# Patient Record
Sex: Female | Born: 1950 | Race: Black or African American | Hispanic: No
Health system: Southern US, Community
[De-identification: ages and names within clinical notes are randomized; demographics above are authoritative.]

## PROBLEM LIST (undated history)

## (undated) DIAGNOSIS — T783XXA Angioneurotic edema, initial encounter: Secondary | ICD-10-CM

## (undated) HISTORY — DX: Angioneurotic edema, initial encounter: T78.3XXA

---

## 2010-06-04 ENCOUNTER — Encounter (HOSPITAL_COMMUNITY): Payer: Self-pay | Admitting: Radiology

## 2010-06-04 ENCOUNTER — Emergency Department (HOSPITAL_COMMUNITY): Payer: Self-pay

## 2010-06-04 ENCOUNTER — Emergency Department (HOSPITAL_COMMUNITY)
Admission: EM | Admit: 2010-06-04 | Discharge: 2010-06-04 | Disposition: A | Discharge status: Home / Self Care | Payer: Self-pay | Attending: Emergency Medicine | Admitting: Emergency Medicine

## 2010-06-04 DIAGNOSIS — R51 Headache: Secondary | ICD-10-CM | POA: Insufficient documentation

## 2010-06-22 ENCOUNTER — Emergency Department (HOSPITAL_COMMUNITY)
Admission: EM | Admit: 2010-06-22 | Discharge: 2010-06-22 | Disposition: A | Discharge status: Home / Self Care | Payer: Self-pay | Attending: Emergency Medicine | Admitting: Emergency Medicine

## 2010-06-22 ENCOUNTER — Emergency Department (HOSPITAL_COMMUNITY): Payer: Self-pay

## 2010-06-22 DIAGNOSIS — M25519 Pain in unspecified shoulder: Secondary | ICD-10-CM | POA: Insufficient documentation

## 2010-06-22 DIAGNOSIS — R079 Chest pain, unspecified: Secondary | ICD-10-CM | POA: Insufficient documentation

## 2010-06-22 DIAGNOSIS — S335XXA Sprain of ligaments of lumbar spine, initial encounter: Secondary | ICD-10-CM | POA: Insufficient documentation

## 2010-06-22 DIAGNOSIS — IMO0002 Reserved for concepts with insufficient information to code with codable children: Secondary | ICD-10-CM | POA: Insufficient documentation

## 2010-06-22 DIAGNOSIS — M545 Low back pain, unspecified: Secondary | ICD-10-CM | POA: Insufficient documentation

## 2010-06-22 DIAGNOSIS — Y929 Unspecified place or not applicable: Secondary | ICD-10-CM | POA: Insufficient documentation

## 2012-04-25 ENCOUNTER — Emergency Department (HOSPITAL_COMMUNITY): Payer: Self-pay

## 2012-04-25 ENCOUNTER — Encounter (HOSPITAL_COMMUNITY): Payer: Self-pay | Admitting: Emergency Medicine

## 2012-04-25 ENCOUNTER — Emergency Department (HOSPITAL_COMMUNITY)
Admission: EM | Admit: 2012-04-25 | Discharge: 2012-04-25 | Disposition: A | Discharge status: Home / Self Care | Payer: Self-pay | Attending: Emergency Medicine | Admitting: Emergency Medicine

## 2012-04-25 DIAGNOSIS — M549 Dorsalgia, unspecified: Secondary | ICD-10-CM | POA: Insufficient documentation

## 2012-04-25 DIAGNOSIS — D259 Leiomyoma of uterus, unspecified: Secondary | ICD-10-CM | POA: Insufficient documentation

## 2012-04-25 DIAGNOSIS — N390 Urinary tract infection, site not specified: Secondary | ICD-10-CM | POA: Insufficient documentation

## 2012-04-25 DIAGNOSIS — D219 Benign neoplasm of connective and other soft tissue, unspecified: Secondary | ICD-10-CM

## 2012-04-25 LAB — CBC WITH DIFFERENTIAL/PLATELET
Eosinophils Absolute: 0.1 10*3/uL (ref 0.0–0.7)
Eosinophils Relative: 1 % (ref 0–5)
Lymphs Abs: 1.4 10*3/uL (ref 0.7–4.0)
MCH: 27.1 pg (ref 26.0–34.0)
MCV: 83.9 fL (ref 78.0–100.0)
Monocytes Relative: 8 % (ref 3–12)
Platelets: 242 10*3/uL (ref 150–400)
RBC: 4.17 MIL/uL (ref 3.87–5.11)

## 2012-04-25 LAB — BASIC METABOLIC PANEL
BUN: 15 mg/dL (ref 6–23)
Calcium: 9.5 mg/dL (ref 8.4–10.5)
GFR calc non Af Amer: >90 mL/min (ref >90)
Glucose, Bld: 96 mg/dL (ref 70–99)
Sodium: 140 mEq/L (ref 135–145)

## 2012-04-25 LAB — URINALYSIS, ROUTINE W REFLEX MICROSCOPIC
Bilirubin Urine: NEGATIVE
Ketones, ur: NEGATIVE mg/dL
Protein, ur: NEGATIVE mg/dL
Specific Gravity, Urine: 1.018 (ref 1.005–1.030)
Urobilinogen, UA: 1 mg/dL (ref 0.0–1.0)

## 2012-04-25 LAB — URINE MICROSCOPIC-ADD ON

## 2012-04-25 MED ORDER — HYDROCODONE-ACETAMINOPHEN 5-325 MG PO TABS: 1.0000 | ORAL_TABLET | Freq: Four times a day (QID) | ORAL | Status: DC | PRN

## 2012-04-25 MED ORDER — HYDROMORPHONE HCL PF 1 MG/ML IJ SOLN: 1.0000 mg | Freq: Once | INTRAMUSCULAR | Status: DC

## 2012-04-25 MED ORDER — OXYCODONE-ACETAMINOPHEN 5-325 MG PO TABS
2.0000 | ORAL_TABLET | Freq: Once | ORAL | Status: AC
  Administered 2012-04-25: 2 via ORAL
  Filled 2012-04-25: qty 2

## 2012-04-25 MED ORDER — CEPHALEXIN 500 MG PO CAPS: 500.0000 mg | ORAL_CAPSULE | Freq: Four times a day (QID) | ORAL | Status: DC

## 2012-04-25 NOTE — ED Notes (Signed)
Pt reports right sided flank pain; feeling like someone punched her and goes into back; pressure when she feels like she has to urinate.

## 2012-04-25 NOTE — ED Provider Notes (Signed)
Medical screening examination/treatment/procedure(s) were performed by non-physician practitioner and as supervising physician I was immediately available for consultation/collaboration.  Geoffery Lyons, MD 04/25/12 2122

## 2012-04-25 NOTE — ED Provider Notes (Signed)
History     CSN: [id number]  Arrival date & time 04/25/12  1052   First MD Initiated Contact with Patient 04/25/12 1111      Chief Complaint  Patient presents with  . Flank Pain    (Consider location/radiation/quality/duration/timing/severity/associated sxs/prior treatment) HPI Comments: 62 year old female presents to the emergency department complaining of right-sided flank pain radiating to her back worsening over the past week. Back in November she felt as if she was punched on her right side and had a sharp pain shoot to her back. The pain was intermittent since, increasing induration over the past week. Nothing in specific makes the pain come or go. She tried taking Tylenol without relief. Admits to associated pressure feeling when she has to urinate. Denies dysuria, hematuria, increased frequency or urgency. No history of kidney stones. Denies fever, chills, nausea or vomiting.  Patient is a 62 y.o. female presenting with flank pain. The history is provided by the patient.  Flank Pain Pertinent negatives include no chest pain, chills, fever, nausea or vomiting.    No past medical history on file.  No past surgical history on file.  No family history on file.  History  Substance Use Topics  . Smoking status: Not on file  . Smokeless tobacco: Not on file  . Alcohol Use: Not on file    OB History    Grav Para Term Preterm Abortions TAB SAB Ect Mult Living                  Review of Systems  Constitutional: Negative for fever, chills and appetite change.  Respiratory: Negative for shortness of breath.   Cardiovascular: Negative for chest pain.  Gastrointestinal: Negative for nausea and vomiting.  Genitourinary: Positive for flank pain. Negative for dysuria, urgency, frequency and hematuria.  Musculoskeletal: Positive for back pain.  Neurological: Negative.   All other systems reviewed and are negative.    Allergies  Review of patient's allergies indicates no  known allergies.  Home Medications   Current Outpatient Rx  Name  Route  Sig  Dispense  Refill  . ACETAMINOPHEN 500 MG PO TABS   Oral   Take 1,000 mg by mouth every 6 (six) hours as needed. For pain           BP 125/73  Pulse 64  Temp 99 F (37.2 C) (Oral)  Resp 22  SpO2 97%  Physical Exam  Nursing note and vitals reviewed. Constitutional: She is oriented to person, place, and time. She appears well-developed. No distress.       Overweight  HENT:  Head: Normocephalic and atraumatic.  Mouth/Throat: Oropharynx is clear and moist.  Eyes: Conjunctivae normal and EOM are normal. Pupils are equal, round, and reactive to light.  Neck: Normal range of motion. Neck supple.  Cardiovascular: Normal rate, regular rhythm and normal heart sounds.   Pulmonary/Chest: Effort normal and breath sounds normal.  Abdominal: Soft. Bowel sounds are normal. There is tenderness (mild to deep palpation) in the suprapubic area. There is CVA tenderness (right). There is no rigidity, no rebound and no guarding.  Musculoskeletal: Normal range of motion. She exhibits no edema.  Neurological: She is alert and oriented to person, place, and time.  Skin: Skin is warm and dry.  Psychiatric: She has a normal mood and affect. Her behavior is normal.    ED Course  Procedures (including critical care time)  Labs Reviewed  URINALYSIS, ROUTINE W REFLEX MICROSCOPIC - Abnormal; Notable for the following:  APPearance TURBID (*)     Hgb urine dipstick TRACE (*)     Nitrite POSITIVE (*)     Leukocytes, UA LARGE (*)     All other components within normal limits  URINE MICROSCOPIC-ADD ON - Abnormal; Notable for the following:    Bacteria, UA MANY (*)     All other components within normal limits  CBC WITH DIFFERENTIAL - Abnormal; Notable for the following:    Hemoglobin 11.3 (*)     HCT 35.0 (*)     All other components within normal limits  BASIC METABOLIC PANEL  URINE CULTURE   Ct Abdomen Pelvis Wo  Contrast  04/25/2012  *RADIOLOGY REPORT*  Clinical Data: Right flank pain radiating to the back.  CT ABDOMEN AND PELVIS WITHOUT CONTRAST  Technique:  Multidetector CT imaging of the abdomen and pelvis was performed following the standard protocol without intravenous contrast.  Comparison: None.  Findings:  The visualized portion of the liver, spleen, pancreas, and adrenal glands appear unremarkable in noncontrast CT appearance.  The gallbladder and biliary system appear unremarkable.  No pathologic retroperitoneal or porta hepatis adenopathy is identified.  Appendix normal.  No renal stones or hydronephrosis.  Renal contour unremarkable.  A broad ventral supraumbilical hernia contains omental adipose tissue.  Urinary bladder unremarkable.  Left ovarian contour unremarkable. Large calcified uterine fibroids include an anterior fundal densely calcified 8.5 x 7.2 x 7.4 cm subserosal fibroid, a densely calcified 3.8 x 4.4 by 3.8 cm left uterine body intramural fibroid, and multiple other less calcified fibroids throughout the uterus somewhat distorting the uterine contour.  Uterine length including the fundal fibroid is 14.6 cm.  The right ovary is not well seen inseparable from the uterine contour.  Left ovary unremarkable.  IMPRESSION:  1.  Large fibroid uterus is likely exerting extrinsic mass effect on the urinary bladder. 2.  No urinary tract calculi or hydronephrosis identified. 3.  Broad ventral supraumbilical hernia contains omental adipose tissue.   Original Report Authenticated By: Gaylyn Rong, M.D.      1. Urinary tract infection   2. Fibroids       MDM  62 year old female with urinary tract infection and uterine fibroids. CAT scan not showing any kidney stones, however it is showing large fibroid uterus pushing on the bladder causing her bladder pressure. UA positive for nitrites, many bacteria and too numerous to count white blood cells. Will prescribe her Keflex for the urinary tract  infection along with pain medication for fibroids. [facility] Resource list also given to establish care with a primary care physician. Patient states her understanding of plan and is agreeable. Return precautions discussed. Patient is afebrile with normal vital signs. Stable for discharge.        Trevor Mace, PA-C 04/25/12 1302

## 2012-04-27 LAB — URINE CULTURE

## 2012-04-28 NOTE — ED Notes (Signed)
+   Urine Patient treated with keflex-sensitive to same-chart appended per protocol MD. 

## 2012-05-23 ENCOUNTER — Emergency Department (HOSPITAL_COMMUNITY)
Admission: EM | Admit: 2012-05-23 | Discharge: 2012-05-23 | Disposition: A | Discharge status: Home / Self Care | Payer: Self-pay | Source: Home / Self Care | Attending: Emergency Medicine | Admitting: Emergency Medicine

## 2012-05-23 ENCOUNTER — Encounter (HOSPITAL_COMMUNITY): Payer: Self-pay

## 2012-05-23 DIAGNOSIS — D219 Benign neoplasm of connective and other soft tissue, unspecified: Secondary | ICD-10-CM

## 2012-05-23 DIAGNOSIS — N390 Urinary tract infection, site not specified: Secondary | ICD-10-CM

## 2012-05-23 DIAGNOSIS — D259 Leiomyoma of uterus, unspecified: Secondary | ICD-10-CM

## 2012-05-23 LAB — POCT URINALYSIS DIP (DEVICE)
Nitrite: POSITIVE — AB
Protein, ur: NEGATIVE mg/dL
Urobilinogen, UA: 2 mg/dL — ABNORMAL HIGH (ref 0.0–1.0)
pH: 5.5 (ref 5.0–8.0)

## 2012-05-23 MED ORDER — NITROFURANTOIN MONOHYD MACRO 100 MG PO CAPS: 100.0000 mg | ORAL_CAPSULE | Freq: Two times a day (BID) | ORAL | Status: DC

## 2012-05-23 MED ORDER — HYDROCODONE-ACETAMINOPHEN 5-325 MG PO TABS: 1.0000 | ORAL_TABLET | ORAL | Status: DC | PRN

## 2012-05-23 NOTE — ED Provider Notes (Signed)
Medical screening examination/treatment/procedure(s) were performed by non-physician practitioner and as supervising physician I was immediately available for consultation/collaboration.  Raynald Blend, MD 05/23/12 1901

## 2012-05-23 NOTE — ED Provider Notes (Signed)
History     CSN: [id number]  Arrival date & time 05/23/12  1530   First MD Initiated Contact with Patient 05/23/12 1745      Chief Complaint  Patient presents with  . Flank Pain    HPI: Patient is a 62 y.o. female presenting with flank pain. The history is provided by the patient.  Flank Pain This is a recurrent problem. The current episode started more than 1 week ago. The problem occurs constantly. The problem has been gradually worsening. Pertinent negatives include no chest pain, no abdominal pain, no headaches and no shortness of breath. Nothing relieves the symptoms.  Pt seen in ED at [facility] for same sx's on 04/25/2012 and treated for UTI. Ct scan at the time did not show kidney stones but did reveal large fibroids. Pt states she took her medication and symptoms did improve but never really resolved. She was supposed to f/u at [facility] but did not. She now has worsening bil flank pain R>L, urgency, frequency and "pressure" w/ urination. Pt denies fever, N/V, unusual vag d/c and states she has not been sexually active in > 10 yrs.   History reviewed. No pertinent past medical history.  History reviewed. No pertinent past surgical history.  No family history on file.  History  Substance Use Topics  . Smoking status: Never Smoker   . Smokeless tobacco: Not on file  . Alcohol Use: Yes    OB History    Grav Para Term Preterm Abortions TAB SAB Ect Mult Living                  Review of Systems  Constitutional: Negative.   HENT: Negative.   Eyes: Negative.   Respiratory: Negative.  Negative for shortness of breath.   Cardiovascular: Negative.  Negative for chest pain.  Gastrointestinal: Negative.  Negative for abdominal pain.  Genitourinary: Positive for dysuria, urgency, frequency and flank pain.  Musculoskeletal: Negative.   Neurological: Negative.  Negative for headaches.  Hematological: Negative.   Psychiatric/Behavioral: Negative.     Allergies  Review  of patient's allergies indicates no known allergies.  Home Medications   Current Outpatient Rx  Name  Route  Sig  Dispense  Refill  . ACETAMINOPHEN 500 MG PO TABS   Oral   Take 1,000 mg by mouth every 6 (six) hours as needed. For pain         . CEPHALEXIN 500 MG PO CAPS   Oral   Take 1 capsule (500 mg total) by mouth 4 (four) times daily.   40 capsule   0   . HYDROCODONE-ACETAMINOPHEN 5-325 MG PO TABS   Oral   Take 1-2 tablets by mouth every 6 (six) hours as needed for pain.   15 tablet   0     BP 120/90  Pulse 66  Temp 98.2 F (36.8 C) (Oral)  Resp 18  SpO2 98%  Physical Exam  Constitutional: She is oriented to person, place, and time. She appears well-developed and well-nourished.  HENT:  Head: Normocephalic and atraumatic.  Eyes: Conjunctivae normal are normal.  Neck: Neck supple.  Cardiovascular: Normal rate and regular rhythm.   Pulmonary/Chest: Effort normal and breath sounds normal.  Abdominal: Soft. Bowel sounds are normal.  Musculoskeletal: Normal range of motion.  Neurological: She is alert and oriented to person, place, and time.  Skin: Skin is warm and dry.  Psychiatric: She has a normal mood and affect.    ED Course  Procedures (including critical care time)  Labs Reviewed  POCT URINALYSIS DIP (DEVICE) - Abnormal; Notable for the following:    Hgb urine dipstick SMALL (*)     Urobilinogen, UA 2.0 (*)     Nitrite POSITIVE (*)     Leukocytes, UA TRACE (*)  Biochemical Testing Only. Please order routine urinalysis from main lab if confirmatory testing is needed.   All other components within normal limits  URINALYSIS, DIPSTICK ONLY   No results found.   No diagnosis found.    MDM  ED note from 04/25/2012:    62 year old female with urinary tract infection and uterine fibroids. CAT scan not showing any kidney stones, however it is showing large fibroid uterus pushing on the bladder causing her bladder pressure. UA positive for nitrites, many  bacteria and too numerous to count white blood cells. Will prescribe her Keflex for the urinary tract infection along with pain medication for fibroids. [facility] Resource list also given to establish care with a primary care physician. Patient states her understanding of plan and is agreeable. Return precautions discussed. Patient is afebrile with normal vital signs. Stable for discharge.  Discussed need for pt to arrange f/u for further eval of her fibroids as this may be the source of the majority of her symptoms. Will treat UTI w/ Macrobid and provide another short course of med for pain. Pt agreeable.         Leanne Chang, NP 05/23/12 1900

## 2012-05-23 NOTE — ED Notes (Signed)
Patient complains of right and left flank pain, more pain on right than left, states that she feels pressure after urinating and that she has, scant urine when she goes to the restroom most of the time sx have been getting worse and started in November, patient has no PCP , pain is constant with some stabbing pains at time

## 2013-10-28 ENCOUNTER — Encounter (HOSPITAL_COMMUNITY): Payer: Self-pay | Admitting: Emergency Medicine

## 2013-10-28 ENCOUNTER — Emergency Department (INDEPENDENT_AMBULATORY_CARE_PROVIDER_SITE_OTHER)
Admission: EM | Admit: 2013-10-28 | Discharge: 2013-10-28 | Disposition: A | Discharge status: Home / Self Care | Payer: Self-pay | Source: Home / Self Care | Attending: Family Medicine | Admitting: Family Medicine

## 2013-10-28 DIAGNOSIS — M545 Low back pain, unspecified: Secondary | ICD-10-CM

## 2013-10-28 DIAGNOSIS — N390 Urinary tract infection, site not specified: Secondary | ICD-10-CM

## 2013-10-28 LAB — POCT URINALYSIS DIP (DEVICE)
BILIRUBIN URINE: NEGATIVE
GLUCOSE, UA: NEGATIVE mg/dL
KETONES UR: NEGATIVE mg/dL
NITRITE: POSITIVE — AB
PH: 5.5 (ref 5.0–8.0)
Protein, ur: NEGATIVE mg/dL
SPECIFIC GRAVITY, URINE: 1.015 (ref 1.005–1.030)
Urobilinogen, UA: 1 mg/dL (ref 0.0–1.0)

## 2013-10-28 MED ORDER — IBUPROFEN 800 MG PO TABS: 800.0000 mg | ORAL_TABLET | Freq: Three times a day (TID) | ORAL | Status: DC | PRN

## 2013-10-28 MED ORDER — CEFUROXIME AXETIL 250 MG PO TABS: 250.0000 mg | ORAL_TABLET | Freq: Two times a day (BID) | ORAL | Status: DC

## 2013-10-28 NOTE — ED Provider Notes (Signed)
CSN: [id number]     Arrival date & time 10/28/13  1448 History   First MD Initiated Contact with Patient 10/28/13 1601     Chief Complaint  Patient presents with  . Back Pain   (Consider location/radiation/quality/duration/timing/severity/associated sxs/prior Treatment) HPI Comments: 63 year old female presents complaining of left-sided lower back pain for 7 days, progressively worsening. She has had associated some gross hematuria and lower abdominal pressure with urinating. Her pain has been gradually worsening. She has had similar symptoms to this in the past associated with UTI. He does flank pain, fever, chills, NVD. No recent travel or sick contacts.  Patient is a 63 y.o. female presenting with back pain.  Back Pain Associated symptoms: abdominal pain and dysuria   Associated symptoms: no fever     History reviewed. No pertinent past medical history. History reviewed. No pertinent past surgical history. No family history on file. History  Substance Use Topics  . Smoking status: Never Smoker   . Smokeless tobacco: Not on file  . Alcohol Use: Yes   OB History   Grav Para Term Preterm Abortions TAB SAB Ect Mult Living                 Review of Systems  Constitutional: Negative for fever and chills.  Gastrointestinal: Positive for abdominal pain. Negative for nausea, vomiting and diarrhea.  Genitourinary: Positive for dysuria, urgency, frequency and hematuria. Negative for flank pain.  Musculoskeletal: Positive for back pain.    Allergies  Review of patient's allergies indicates no known allergies.  Home Medications   Prior to Admission medications   Medication Sig Start Date End Date Taking? Authorizing Provider  acetaminophen (TYLENOL) 500 MG tablet Take 1,000 mg by mouth every 6 (six) hours as needed. For pain    Historical Provider, MD  cefUROXime (CEFTIN) 250 MG tablet Take 1 tablet (250 mg total) by mouth 2 (two) times daily with a meal. 10/28/13   Liam Graham,  PA-C  cephALEXin (KEFLEX) 500 MG capsule Take 1 capsule (500 mg total) by mouth 4 (four) times daily. 04/25/12   Illene Labrador, PA-C  HYDROcodone-acetaminophen (NORCO/VICODIN) 5-325 MG per tablet Take 1-2 tablets by mouth every 6 (six) hours as needed for pain. 04/25/12   Illene Labrador, PA-C  HYDROcodone-acetaminophen (NORCO/VICODIN) 5-325 MG per tablet Take 1 tablet by mouth every 4 (four) hours as needed for pain. 05/23/12   Rhetta Mura Schorr, NP  ibuprofen (ADVIL,MOTRIN) 800 MG tablet Take 1 tablet (800 mg total) by mouth every 8 (eight) hours as needed. 10/28/13   Liam Graham, PA-C  nitrofurantoin, macrocrystal-monohydrate, (MACROBID) 100 MG capsule Take 1 capsule (100 mg total) by mouth 2 (two) times daily. 05/23/12   Rhetta Mura Schorr, NP   BP 138/69  Pulse 61  Temp(Src) 98.7 F (37.1 C) (Oral)  Resp 16  SpO2 100% Physical Exam  Nursing note and vitals reviewed. Constitutional: She is oriented to person, place, and time. Vital signs are normal. She appears well-developed and well-nourished. No distress.  HENT:  Head: Normocephalic and atraumatic.  Cardiovascular: Normal rate, regular rhythm and normal heart sounds.   Pulmonary/Chest: Effort normal and breath sounds normal. No respiratory distress.  Abdominal: Soft. She exhibits no distension and no mass. There is no tenderness. There is no rebound, no guarding and no CVA tenderness.  Musculoskeletal:       Lumbar back: Normal.  Neurological: She is alert and oriented to person, place, and time. She has normal strength. Coordination normal.  Skin: Skin is warm and dry. No rash noted. She is not diaphoretic.  Psychiatric: She has a normal mood and affect. Judgment normal.    ED Course  Procedures (including critical care time) Labs Review Labs Reviewed  POCT URINALYSIS DIP (DEVICE) - Abnormal; Notable for the following:    Hgb urine dipstick LARGE (*)    Nitrite POSITIVE (*)    Leukocytes, UA MODERATE (*)    All other  components within normal limits  URINE CULTURE    Imaging Review No results found.   MDM   1. UTI (lower urinary tract infection)   2. Left-sided low back pain without sciatica    Physical exam is normal. Urine is positive for leukocytes, nitrites, blood. Treating for a UTI. Followup if no improvement in a couple days. Urine culture sent   Meds ordered this encounter  Medications  . cefUROXime (CEFTIN) 250 MG tablet    Sig: Take 1 tablet (250 mg total) by mouth 2 (two) times daily with a meal.    Dispense:  10 tablet    Refill:  0    Order Specific Question:  Supervising Provider    Answer:  Jake Michaelis, DAVID C [id number]  . ibuprofen (ADVIL,MOTRIN) 800 MG tablet    Sig: Take 1 tablet (800 mg total) by mouth every 8 (eight) hours as needed.    Dispense:  30 tablet    Refill:  0    Order Specific Question:  Supervising Provider    Answer:  Jake Michaelis, DAVID C [[id number]]       Liam Graham, PA-C 10/28/13 [phone]

## 2013-10-28 NOTE — Discharge Instructions (Signed)
Back Pain, Adult °Low back pain is very common. About 1 in 5 people have back pain. The cause of low back pain is rarely dangerous. The pain often gets better over time. About half of people with a sudden onset of back pain feel better in just 2 weeks. About 8 in 10 people feel better by 6 weeks.  °CAUSES °Some common causes of back pain include: °· Strain of the muscles or ligaments supporting the spine. °· Wear and tear (degeneration) of the spinal discs. °· Arthritis. °· Direct injury to the back. °DIAGNOSIS °Most of the time, the direct cause of low back pain is not known. However, back pain can be treated effectively even when the exact cause of the pain is unknown. Answering your caregiver's questions about your overall health and symptoms is one of the most accurate ways to make sure the cause of your pain is not dangerous. If your caregiver needs more information, he or she may order lab work or imaging tests (X-rays or MRIs). However, even if imaging tests show changes in your back, this usually does not require surgery. °HOME CARE INSTRUCTIONS °For many people, back pain returns. Since low back pain is rarely dangerous, it is often a condition that people can learn to manage on their own.  °· Remain active. It is stressful on the back to sit or stand in one place. Do not sit, drive, or stand in one place for more than 30 minutes at a time. Take short walks on level surfaces as soon as pain allows. Try to increase the length of time you walk each day. °· Do not stay in bed. Resting more than 1 or 2 days can delay your recovery. °· Do not avoid exercise or work. Your body is made to move. It is not dangerous to be active, even though your back may hurt. Your back will likely heal faster if you return to being active before your pain is gone. °· Pay attention to your body when you  bend and lift. Many people have less discomfort when lifting if they bend their knees, keep the load close to their bodies, and  avoid twisting. Often, the most comfortable positions are those that put less stress on your recovering back. °· Find a comfortable position to sleep. Use a firm mattress and lie on your side with your knees slightly bent. If you lie on your back, put a pillow under your knees. °· Only take over-the-counter or prescription medicines as directed by your caregiver. Over-the-counter medicines to reduce pain and inflammation are often the most helpful. Your caregiver may prescribe muscle relaxant drugs. These medicines help dull your pain so you can more quickly return to your normal activities and healthy exercise. °· Put ice on the injured area. °¨ Put ice in a plastic bag. °¨ Place a towel between your skin and the bag. °¨ Leave the ice on for 15-20 minutes, 03-04 times a day for the first 2 to 3 days. After that, ice and heat may be alternated to reduce pain and spasms. °· Ask your caregiver about trying back exercises and gentle massage. This may be of some benefit. °· Avoid feeling anxious or stressed. Stress increases muscle tension and can worsen back pain. It is important to recognize when you are anxious or stressed and learn ways to manage it. Exercise is a great option. °SEEK MEDICAL CARE IF: °· You have pain that is not relieved with rest or medicine. °· You have pain that does not improve in 1 week. °· You have new symptoms. °· You are generally not feeling well. °SEEK   IMMEDIATE MEDICAL CARE IF:   You have pain that radiates from your back into your legs.  You develop new bowel or bladder control problems.  You have unusual weakness or numbness in your arms or legs.  You develop nausea or vomiting.  You develop abdominal pain.  You feel faint. Document Released: 04/07/2005 Document Revised: 10/07/2011 Document Reviewed: 08/26/2010 [facility] Patient Information 2015 [place], [place]. This information is not intended to replace advice given to you by your health care provider. Make sure you  discuss any questions you have with your health care provider.  Urinary Tract Infection Urinary tract infections (UTIs) can develop anywhere along your urinary tract. Your urinary tract is your body's drainage system for removing wastes and extra water. Your urinary tract includes two kidneys, two ureters, a bladder, and a urethra. Your kidneys are a pair of bean-shaped organs. Each kidney is about the size of your fist. They are located below your ribs, one on each side of your spine. CAUSES Infections are caused by microbes, which are microscopic organisms, including fungi, viruses, and bacteria. These organisms are so small that they can only be seen through a microscope. Bacteria are the microbes that most commonly cause UTIs. SYMPTOMS  Symptoms of UTIs may vary by age and gender of the patient and by the location of the infection. Symptoms in young women typically include a frequent and intense urge to urinate and a painful, burning feeling in the bladder or urethra during urination. Older women and men are more likely to be tired, shaky, and weak and have muscle aches and abdominal pain. A fever may mean the infection is in your kidneys. Other symptoms of a kidney infection include pain in your back or sides below the ribs, nausea, and vomiting. DIAGNOSIS To diagnose a UTI, your caregiver will ask you about your symptoms. Your caregiver also will ask to provide a urine sample. The urine sample will be tested for bacteria and white blood cells. White blood cells are made by your body to help fight infection. TREATMENT  Typically, UTIs can be treated with medication. Because most UTIs are caused by a bacterial infection, they usually can be treated with the use of antibiotics. The choice of antibiotic and length of treatment depend on your symptoms and the type of bacteria causing your infection. HOME CARE INSTRUCTIONS  If you were prescribed antibiotics, take them exactly as your caregiver  instructs you. Finish the medication even if you feel better after you have only taken some of the medication.  Drink enough water and fluids to keep your urine clear or pale yellow.  Avoid caffeine, tea, and carbonated beverages. They tend to irritate your bladder.  Empty your bladder often. Avoid holding urine for long periods of time.  Empty your bladder before and after sexual intercourse.  After a bowel movement, women should cleanse from front to back. Use each tissue only once. SEEK MEDICAL CARE IF:   You have back pain.  You develop a fever.  Your symptoms do not begin to resolve within 3 days. SEEK IMMEDIATE MEDICAL CARE IF:   You have severe back pain or lower abdominal pain.  You develop chills.  You have nausea or vomiting.  You have continued burning or discomfort with urination. MAKE SURE YOU:   Understand these instructions.  Will watch your condition.  Will get help right away if you are not doing well or get worse. Document Released: 01/15/2005 Document Revised: 10/07/2011 Document Reviewed: 05/16/2011 [employer] Patient Information  2015 [employer]. This information is not intended to replace advice given to you by your health care provider. Make sure you discuss any questions you have with your health care provider.

## 2013-10-28 NOTE — ED Notes (Signed)
Pt c/o lower back pain onset 7 days; getting worse; increases w/activity Sx also include: hematuria, abd pressure Denies f/v/n/d Slow gait Alert w/no signs of acute distress.

## 2013-10-29 LAB — URINE CULTURE: Colony Count: >=100000

## 2013-10-30 NOTE — ED Provider Notes (Signed)
Medical screening examination/treatment/procedure(s) were performed by resident physician or non-physician practitioner and as supervising physician I was immediately available for consultation/collaboration.   Pauline Good MD.   Billy Fischer, MD 10/30/13 [phone]

## 2014-03-20 ENCOUNTER — Emergency Department (HOSPITAL_COMMUNITY)
Admission: EM | Admit: 2014-03-20 | Discharge: 2014-03-20 | Disposition: A | Discharge status: Home / Self Care | Payer: Self-pay | Attending: Emergency Medicine | Admitting: Emergency Medicine

## 2014-03-20 DIAGNOSIS — Y9389 Activity, other specified: Secondary | ICD-10-CM | POA: Insufficient documentation

## 2014-03-20 DIAGNOSIS — Y998 Other external cause status: Secondary | ICD-10-CM | POA: Insufficient documentation

## 2014-03-20 DIAGNOSIS — X58XXXA Exposure to other specified factors, initial encounter: Secondary | ICD-10-CM | POA: Insufficient documentation

## 2014-03-20 DIAGNOSIS — T783XXA Angioneurotic edema, initial encounter: Secondary | ICD-10-CM | POA: Insufficient documentation

## 2014-03-20 DIAGNOSIS — Z792 Long term (current) use of antibiotics: Secondary | ICD-10-CM | POA: Insufficient documentation

## 2014-03-20 DIAGNOSIS — Z7982 Long term (current) use of aspirin: Secondary | ICD-10-CM | POA: Insufficient documentation

## 2014-03-20 DIAGNOSIS — Z79899 Other long term (current) drug therapy: Secondary | ICD-10-CM | POA: Insufficient documentation

## 2014-03-20 DIAGNOSIS — Y92511 Restaurant or cafe as the place of occurrence of the external cause: Secondary | ICD-10-CM | POA: Insufficient documentation

## 2014-03-20 MED ORDER — FAMOTIDINE 20 MG PO TABS: 20.0000 mg | ORAL_TABLET | Freq: Two times a day (BID) | ORAL | Status: DC

## 2014-03-20 MED ORDER — METHYLPREDNISOLONE SODIUM SUCC 125 MG IJ SOLR
125.0000 mg | Freq: Once | INTRAMUSCULAR | Status: AC
  Administered 2014-03-20: 125 mg via INTRAVENOUS
  Filled 2014-03-20: qty 2

## 2014-03-20 MED ORDER — PREDNISONE 20 MG PO TABS: 60.0000 mg | ORAL_TABLET | Freq: Every day | ORAL | Status: DC

## 2014-03-20 MED ORDER — DIPHENHYDRAMINE HCL 50 MG/ML IJ SOLN
25.0000 mg | Freq: Once | INTRAMUSCULAR | Status: AC
  Administered 2014-03-20: 25 mg via INTRAVENOUS
  Filled 2014-03-20: qty 1

## 2014-03-20 MED ORDER — LORATADINE 10 MG PO TABS: 10.0000 mg | ORAL_TABLET | Freq: Every day | ORAL | Status: DC

## 2014-03-20 MED ORDER — FAMOTIDINE IN NACL 20-0.9 MG/50ML-% IV SOLN
20.0000 mg | Freq: Once | INTRAVENOUS | Status: AC
  Administered 2014-03-20: 20 mg via INTRAVENOUS
  Filled 2014-03-20: qty 50

## 2014-03-20 MED ORDER — DIPHENHYDRAMINE HCL 25 MG PO TABS: 25.0000 mg | ORAL_TABLET | Freq: Four times a day (QID) | ORAL | Status: DC

## 2014-03-20 NOTE — ED Notes (Signed)
Pt sleeping, reports tongue feels better, "still feels sore on the sides and swollen". No respiratory distress.

## 2014-03-20 NOTE — ED Notes (Signed)
The pt just finished a 12 hour drive from [place]  Back home in [place]

## 2014-03-20 NOTE — ED Provider Notes (Signed)
CSN: [id number]     Arrival date & time 03/20/14  0105 History   First MD Initiated Contact with Patient 03/20/14 0114     Chief Complaint  Patient presents with  . Oral Swelling     (Consider location/radiation/quality/duration/timing/severity/associated sxs/prior Treatment) HPI 63 year old female presents to the emergency department with complaint of tongue swelling.  Symptoms started about 2 hours ago after having a milkshake for [employer] with cherries and two cucumbers.  She denies previous history of swelling of her tongue.  She denies being on any current medications.  She has had some itching to the left side of her tongue, and slight difficulty swallowing.  She denies any shortness of breath.  She reports swelling has been stable over the last 2 hours. No past medical history on file. No past surgical history on file. No family history on file. History  Substance Use Topics  . Smoking status: Never Smoker   . Smokeless tobacco: Not on file  . Alcohol Use: Yes   OB History    No data available     Review of Systems   See History of Present Illness; otherwise all other systems are reviewed and negative  Allergies  Review of patient's allergies indicates no known allergies.  Home Medications   Prior to Admission medications   Medication Sig Start Date End Date Taking? Authorizing Provider  acetaminophen (TYLENOL) 500 MG tablet Take 1,000 mg by mouth every 6 (six) hours as needed. For pain   Yes Historical Provider, MD  ASPIRIN PO Take 2 tablets by mouth daily as needed (pain).   Yes Historical Provider, MD  HYDROcodone-acetaminophen (NORCO/VICODIN) 5-325 MG per tablet Take 1 tablet by mouth every 4 (four) hours as needed for pain. 05/23/12  Yes Rhetta Mura Schorr, NP  ibuprofen (ADVIL,MOTRIN) 800 MG tablet Take 1 tablet (800 mg total) by mouth every 8 (eight) hours as needed. Patient taking differently: Take 800 mg by mouth every 8 (eight) hours as needed for moderate  pain.  10/28/13  Yes Liam Graham, PA-C  cefUROXime (CEFTIN) 250 MG tablet Take 1 tablet (250 mg total) by mouth 2 (two) times daily with a meal. Patient not taking: Reported on 03/20/2014 10/28/13   Liam Graham, PA-C  cephALEXin (KEFLEX) 500 MG capsule Take 1 capsule (500 mg total) by mouth 4 (four) times daily. Patient not taking: Reported on 03/20/2014 04/25/12   Carman Ching, PA-C  HYDROcodone-acetaminophen (NORCO/VICODIN) 5-325 MG per tablet Take 1-2 tablets by mouth every 6 (six) hours as needed for pain. Patient not taking: Reported on 03/20/2014 04/25/12   Carman Ching, PA-C  nitrofurantoin, macrocrystal-monohydrate, (MACROBID) 100 MG capsule Take 1 capsule (100 mg total) by mouth 2 (two) times daily. Patient not taking: Reported on 03/20/2014 05/23/12   Rhetta Mura Schorr, NP   BP 125/69 mmHg  Pulse 59  Temp(Src) 99.1 F (37.3 C) (Oral)  Resp 20  SpO2 100% Physical Exam  Constitutional: She is oriented to person, place, and time. She appears well-developed and well-nourished.  HENT:  Head: Normocephalic and atraumatic.  Right Ear: External ear normal.  Left Ear: External ear normal.  Nose: Nose normal.  Mouth/Throat: Oropharynx is clear and moist.  Patient has soft tissue swelling of the tongue, no obstruction of the airway.  Eyes: Conjunctivae and EOM are normal. Pupils are equal, round, and reactive to light.  Neck: Normal range of motion. Neck supple. No JVD present. No tracheal deviation present. No thyromegaly present.  Cardiovascular: Normal rate,  regular rhythm, normal heart sounds and intact distal pulses.  Exam reveals no gallop and no friction rub.   No murmur heard. Pulmonary/Chest: Effort normal and breath sounds normal. No stridor. No respiratory distress. She has no wheezes. She has no rales. She exhibits no tenderness.  Abdominal: Soft. Bowel sounds are normal. She exhibits no distension and no mass. There is no tenderness. There is no rebound and no guarding.   Musculoskeletal: Normal range of motion. She exhibits no edema or tenderness.  Lymphadenopathy:    She has no cervical adenopathy.  Neurological: She is alert and oriented to person, place, and time. She displays normal reflexes. She exhibits normal muscle tone. Coordination normal.  Skin: Skin is warm and dry. No rash noted. No erythema. No pallor.  Psychiatric: She has a normal mood and affect. Her behavior is normal. Judgment and thought content normal.  Nursing note and vitals reviewed.   ED Course  Procedures (including critical care time) Labs Review Labs Reviewed - No data to display  Imaging Review No results found.   EKG Interpretation None      MDM   Final diagnoses:  Angioedema, initial encounter    63 year old female with angioedema of the tongue.  Patient is not on ACE inhibitor.  No prior history of same.  No trauma to the tongue, no new foods.  Patient received allergy cocktail to contain Solu-Medrol, Benadryl and Pepcid.  Will monitor.  3:04 AM No further swelling, but no resolution of previous swelling.  We'll continue to monitor.  5:39 AM Patient reevaluated.  Swelling is resolving.  Patient feeling better, ready for discharge home.  Kalman Drape, MD 03/20/14 [phone]

## 2014-03-20 NOTE — ED Notes (Signed)
She feels,like the swelling in her tongue is going down

## 2014-03-20 NOTE — ED Notes (Signed)
Patient here with swollen tongue. States that it started about 2 hours ago after eating milkshake, cherries, and cucumbers.

## 2014-03-20 NOTE — ED Notes (Signed)
The pt reports tongue swelling after eating canned cherries approx 2100.  Her tongue started swelling with ridges on her tongue.  No itching anywhere.  No other symptoms.  Alert no resp distress

## 2014-03-20 NOTE — Discharge Instructions (Signed)
Take medications as prescribed.  You may take Claritin during the day and Benadryl at night if you find that Benadryl makes you to sleepy.  Please follow-up with a local allergist for further testing for your angioedema of the tongue.   Angioedema Angioedema is a sudden swelling of tissues, often of the skin. It can occur on the face or genitals or in the abdomen or other body parts. The swelling usually develops over a short period and gets better in 24 to 48 hours. It often begins during the night and is found when the person wakes up. The person may also get red, itchy patches of skin (hives). Angioedema can be dangerous if it involves swelling of the air passages.  Depending on the cause, episodes of angioedema may only happen once, come back in unpredictable patterns, or repeat for several years and then gradually fade away.  CAUSES  Angioedema can be caused by an allergic reaction to various triggers. It can also result from nonallergic causes, including reactions to drugs, immune system disorders, viral infections, or an abnormal gene that is passed to you from your parents (hereditary). For some people with angioedema, the cause is unknown.  Some things that can trigger angioedema include:   Foods.   Medicines, such as ACE inhibitors, ARBs, nonsteroidal anti-inflammatory agents, or estrogen.   Latex.   Animal saliva.   Insect stings.   Dyes used in X-rays.   Mild injury.   Dental work.  Surgery.  Stress.   Sudden changes in temperature.   Exercise. SIGNS AND SYMPTOMS   Swelling of the skin.  Hives. If these are present, there is also intense itching.  Redness in the affected area.   Pain in the affected area.  Swollen lips or tongue.  Breathing problems. This may happen if the air passages swell.  Wheezing. If internal organs are involved, there may be:   Nausea.   Abdominal pain.   Vomiting.   Difficulty swallowing.   Difficulty passing  urine. DIAGNOSIS   Your health care provider will examine the affected area and take a medical and family history.  Various tests may be done to help determine the cause. Tests may include:  Allergy skin tests to see if the problem is an allergic reaction.   Blood tests to check for hereditary angioedema.   Tests to check for underlying diseases that could cause the condition.   A review of your medicines, including over-the-counter medicines, may be done. TREATMENT  Treatment will depend on the cause of the angioedema. Possible treatments include:   Removal of anything that triggered the condition (such as stopping certain medicines).   Medicines to treat symptoms or prevent attacks. Medicines given may include:   Antihistamines.   Epinephrine injection.   Steroids.   Hospitalization may be required for severe attacks. If the air passages are affected, it can be an emergency. Tubes may need to be placed to keep the airway open. HOME CARE INSTRUCTIONS   Take all medicines as directed by your health care provider.  If you were given medicines for emergency allergy treatment, always carry them with you.  Wear a medical bracelet as directed by your health care provider.   Avoid known triggers. SEEK MEDICAL CARE IF:   You have repeat attacks of angioedema.   Your attacks are more frequent or more severe despite preventive measures.   You have hereditary angioedema and are considering having children. It is important to discuss with your health care provider  the risks of passing the condition on to your children. SEEK IMMEDIATE MEDICAL CARE IF:   You have severe swelling of the mouth, tongue, or lips.  You have difficulty breathing.   You have difficulty swallowing.   You faint. MAKE SURE YOU:  Understand these instructions.  Will watch your condition.  Will get help right away if you are not doing well or get worse. Document Released: 06/16/2001  Document Revised: 08/22/2013 Document Reviewed: 11/29/2012 [facility] Patient Information 2015 [place], [place]. This information is not intended to replace advice given to you by your health care provider. Make sure you discuss any questions you have with your health care provider.

## 2015-03-26 ENCOUNTER — Ambulatory Visit: Payer: Self-pay | Admitting: Family Medicine

## 2015-10-29 ENCOUNTER — Encounter (HOSPITAL_COMMUNITY): Payer: Self-pay | Admitting: Family Medicine

## 2015-10-29 ENCOUNTER — Emergency Department (HOSPITAL_COMMUNITY)
Admission: EM | Admit: 2015-10-29 | Discharge: 2015-10-29 | Disposition: A | Discharge status: Home / Self Care | Payer: Self-pay | Attending: Physician Assistant | Admitting: Physician Assistant

## 2015-10-29 DIAGNOSIS — Y999 Unspecified external cause status: Secondary | ICD-10-CM | POA: Insufficient documentation

## 2015-10-29 DIAGNOSIS — X58XXXA Exposure to other specified factors, initial encounter: Secondary | ICD-10-CM | POA: Insufficient documentation

## 2015-10-29 DIAGNOSIS — Y939 Activity, unspecified: Secondary | ICD-10-CM | POA: Insufficient documentation

## 2015-10-29 DIAGNOSIS — Y929 Unspecified place or not applicable: Secondary | ICD-10-CM | POA: Insufficient documentation

## 2015-10-29 DIAGNOSIS — T192XXA Foreign body in vulva and vagina, initial encounter: Secondary | ICD-10-CM

## 2015-10-29 LAB — WET PREP, GENITAL
Clue Cells Wet Prep HPF POC: NONE SEEN
Sperm: NONE SEEN
Trich, Wet Prep: NONE SEEN
Yeast Wet Prep HPF POC: NONE SEEN

## 2015-10-29 MED ORDER — CEFTRIAXONE SODIUM 250 MG IJ SOLR
250.0000 mg | Freq: Once | INTRAMUSCULAR | Status: AC
  Administered 2015-10-29: 250 mg via INTRAMUSCULAR
  Filled 2015-10-29: qty 250

## 2015-10-29 MED ORDER — AZITHROMYCIN 250 MG PO TABS
1000.0000 mg | ORAL_TABLET | Freq: Once | ORAL | Status: AC
  Administered 2015-10-29: 1000 mg via ORAL
  Filled 2015-10-29: qty 4

## 2015-10-29 MED ORDER — LIDOCAINE HCL (PF) 1 % IJ SOLN
INTRAMUSCULAR | Status: AC
  Administered 2015-10-29: 2 mL
  Filled 2015-10-29: qty 5

## 2015-10-29 MED ORDER — LIDOCAINE HCL (PF) 1 % IJ SOLN
2.0000 mL | Freq: Once | INTRAMUSCULAR | Status: AC
  Administered 2015-10-29: 2 mL

## 2015-10-29 NOTE — ED Notes (Signed)
Pt here for possibility of condom stuck in vagina after sex.

## 2015-10-29 NOTE — Discharge Instructions (Signed)
Vaginal Foreign Body A vaginal foreign body is any object that gets stuck or left inside the vagina. Vaginal foreign bodies left in the vagina for a long time can cause irritation and infection. In most cases, symptoms go away once the vaginal foreign body is found and removed. Rarely, a foreign object can break through the walls of the vagina and cause a serious infection inside the abdomen.  CAUSES  The most common vaginal foreign bodies are:  Tampons.  Contraceptive devices.  Toilet tissue left in the vagina.  Small objects that were placed in the vagina out of curiosity and got stuck.  A result of sexual abuse. SIGNS AND SYMPTOMS  Light vaginal bleeding.  Blood-tinged vaginal fluid (discharge).  Vaginal discharge that smells bad.  Vaginal itching or burning.  Redness, swelling, or rash near the opening of the vagina.  Abdominal pain.  Fever.  Burning or frequent urination. DIAGNOSIS  Your health care provider may be able to diagnose a vaginal foreign body based on the information you provide, your symptoms, and a physical exam. Your health care provider may also perform the following tests to check for infection:  A swab of the discharge to check under a microscope for bacteria (culture).  A urine culture.  An examination of the vagina with a small, lighted scope (vaginoscopy).  Imaging tests to get a picture of the inside of your vagina, such as:  Ultrasound.  X-ray.  MRI. TREATMENT  In most cases, a vaginal foreign body can be easily removed and the symptoms usually go away very quickly. Other treatment may include:   If the vaginal foreign body is not easily removed, medicine may be given to make you go to sleep (general anesthesia) to have the object removed.  Emergency surgery may be necessary if an infection spreads through the walls of the vagina into the abdomen (acute abdomen). This is rare.  You may need to take antibiotic medicine if you have a  vaginal or urinary tract infection. HOME CARE INSTRUCTIONS   Take medicines only as directed by your health care provider.  If you were prescribed an antibiotic medicine, finish it all even if you start to feel better.  Do not have sex or use tampons until your health care provider says it is okay.  Do not douche or use vaginal rinses unless your health care provider recommends it.  Keep all follow-up visits as directed by your health care provider. This is important. SEEK MEDICAL CARE IF:  You have abdominal pain or burning pain when urinating.  You have a fever. SEEK IMMEDIATE MEDICAL CARE IF:  You have heavy vaginal bleeding or discharge.   You have very bad abdominal pain.  MAKE SURE YOU:  Understand these instructions.  Will watch your condition.  Will get help right away if you are not doing well or get worse.   This information is not intended to replace advice given to you by your health care provider. Make sure you discuss any questions you have with your health care provider.  You have gonorrhea, chlamydia, HIV and syphilis testing that is still pending. These results in the next 2-3 days. Return to the ED if you experience abdominal pain, fevers, chills, vaginal discharge, burning with urination. Otherwise, he may follow up with her OB/GYN.

## 2015-10-29 NOTE — ED Provider Notes (Signed)
CSN: [id number]     Arrival date & time 10/29/15  1301 History   First MD Initiated Contact with Patient 10/29/15 1850     Chief Complaint  Patient presents with  . Foreign Body in Vagina     (Consider location/radiation/quality/duration/timing/severity/associated sxs/prior Treatment) HPI  Cambrea Orner is a 65 y.o F With no significant past medical history presents the ED today complaining of retained foreign body in her vagina. Patient states that she had sexual intercourse 2 days ago and used a condom. After intercourse they were unable to find the condom and she is concerned that it made still be retained in her vagina. Patient has been sexually active since that time without condom usage. She denies any vaginal discharge, vaginal bleeding, abdominal pain, fevers. Patient has been through menopause. Patient would also like to be checked for STDs.  History reviewed. No pertinent past medical history. History reviewed. No pertinent past surgical history. History reviewed. No pertinent family history. Social History  Substance Use Topics  . Smoking status: Never Smoker   . Smokeless tobacco: None  . Alcohol Use: None   OB History    No data available     Review of Systems  All other systems reviewed and are negative.     Allergies  Review of patient's allergies indicates no known allergies.  Home Medications   Prior to Admission medications   Not on File   BP 127/111 mmHg  Pulse 57  Temp(Src) 98.2 F (36.8 C) (Oral)  Resp 20  Ht 5\' 7"  (1.702 m)  Wt 95.255 kg  BMI 32.88 kg/m2  SpO2 100% Physical Exam  Constitutional: She is oriented to person, place, and time. She appears well-developed and well-nourished. No distress.  HENT:  Head: Normocephalic and atraumatic.  Eyes: Conjunctivae are normal. Right eye exhibits no discharge. Left eye exhibits no discharge. No scleral icterus.  Cardiovascular: Normal rate.   Pulmonary/Chest: Effort normal.  Abdominal:  Soft. Bowel sounds are normal. She exhibits no distension and no mass. There is no tenderness. There is no rebound and no guarding.  Genitourinary:  Retained condom in vaginal canal, removed with forceps. Post removal, cervix appears normal. No vaginal bleeding or discharge. No prolapse. No external genital lesions.  Neurological: She is alert and oriented to person, place, and time. Coordination normal.  Skin: Skin is warm and dry. No rash noted. She is not diaphoretic. No erythema. No pallor.  Psychiatric: She has a normal mood and affect. Her behavior is normal.  Nursing note and vitals reviewed.   ED Course  .Foreign Body Removal Date/Time: 10/29/2015 7:43 PM Performed by: Donnald Garre TRIPP Authorized by: Donnald Garre TRIPP Risks and benefits: risks, benefits and alternatives were discussed Consent given by: patient Patient understanding: patient states understanding of the procedure being performed Patient identity confirmed: verbally with patient Body area: vagina Patient sedated: no Patient cooperative: yes Localization method: speculum Removal mechanism: forceps Complexity: simple Number of foreign bodies recovered: 1. Foreign bodies recovered: condom. Post-procedure assessment: foreign body removed Patient tolerance: Patient tolerated the procedure well with no immediate complications   (including critical care time)   Labs Review Labs Reviewed  WET PREP, GENITAL - Abnormal; Notable for the following:    WBC, Wet Prep HPF POC MANY (*)    All other components within normal limits  RPR  HIV ANTIBODY (ROUTINE TESTING)  GC/CHLAMYDIA PROBE AMP ([place]) NOT AT [facility]    Imaging Review No results found. I have personally reviewed and evaluated  these images and lab results as part of my medical decision-making.   EKG Interpretation None      MDM   Final diagnoses:  Retained foreign body of vagina, initial encounter    65 year old female presents  the ED complains of retained foreign body in her vagina. On physical exam there is a retained condom in the vaginal canal. This was removed with forceps. Patient tolerated the procedure well. Patient also requested to be tested for STDs. She denies any vaginal discharge, dysuria, abdominal pain or dyspareunia. GC, chlamydia, HIV and RPR were sent. Patient was treated prophylactically with azithromycin and Rocephin in the ED. Safe sex practices encouraged recommended. Recommend follow-up with OB/GYN as needed. Return precautions outlined in patient discharge instructions.    Dondra Spry Elkin, PA-C 10/29/15 2014  Courteney Julio Alm, MD 10/29/15 [phone]

## 2015-10-30 LAB — HIV ANTIBODY (ROUTINE TESTING W REFLEX): HIV Screen 4th Generation wRfx: NONREACTIVE

## 2015-10-30 LAB — GC/CHLAMYDIA PROBE AMP (~~LOC~~) NOT AT ARMC
Chlamydia: NEGATIVE
Neisseria Gonorrhea: NEGATIVE

## 2015-10-30 LAB — SYPHILIS: RPR W/REFLEX TO RPR TITER AND TREPONEMAL ANTIBODIES, TRADITIONAL SCREENING AND DIAGNOSIS ALGORITHM: RPR Ser Ql: NONREACTIVE

## 2017-06-24 ENCOUNTER — Other Ambulatory Visit: Payer: Self-pay

## 2017-06-24 ENCOUNTER — Encounter (HOSPITAL_COMMUNITY): Payer: Self-pay

## 2017-06-24 ENCOUNTER — Emergency Department (HOSPITAL_COMMUNITY): Payer: Self-pay

## 2017-06-24 ENCOUNTER — Emergency Department (HOSPITAL_COMMUNITY)
Admission: EM | Admit: 2017-06-24 | Discharge: 2017-06-24 | Disposition: A | Discharge status: Home / Self Care | Payer: Self-pay | Attending: Emergency Medicine | Admitting: Emergency Medicine

## 2017-06-24 DIAGNOSIS — J189 Pneumonia, unspecified organism: Secondary | ICD-10-CM | POA: Insufficient documentation

## 2017-06-24 MED ORDER — DOXYCYCLINE HYCLATE 100 MG PO CAPS: 100.0000 mg | ORAL_CAPSULE | Freq: Two times a day (BID) | ORAL | 0 refills | Status: DC

## 2017-06-24 MED ORDER — IBUPROFEN 800 MG PO TABS: 800.0000 mg | ORAL_TABLET | Freq: Three times a day (TID) | ORAL | 0 refills | Status: DC

## 2017-06-24 MED ORDER — FLUTICASONE PROPIONATE 50 MCG/ACT NA SUSP: 1.0000 | Freq: Every day | NASAL | 2 refills | Status: DC

## 2017-06-24 NOTE — ED Provider Notes (Signed)
[facility]ENC[facility] DEPARTMENT Provider Note   CSN: [id number] Arrival date & time: 06/24/17  1007     History   Chief Complaint Chief Complaint  Patient presents with  . cough/chestwall pain    HPI Nyeemah Jennette is a 67 y.o. female without significant past medical hx who presents to the ED with complaint of influenza like sxs for the past 4 days. Patient states she is experiencing congestion, rhinorrhea, sore throat, productive cough with green mucous sputum, subjective fever, chills, and nausea/vomiting- has had 3 episodes of non-bloody emesis and has been able to tolerate PO today. States she is also having generalized body aches including her chest, back, and extremities states it is "bone and muscle pain"  Relays this is worse with coughing and if she presses on these areas. Pain is not exertional. Denies dyspnea, abdominal pain, diarrhea, or blood in stool.   HPI  History reviewed. No pertinent past medical history.  There are no active problems to display for this patient.   History reviewed. No pertinent surgical history.  OB History    No data available       Home Medications    Prior to Admission medications   Not on File    Family History History reviewed. No pertinent family history.  Social History Social History   Tobacco Use  . Smoking status: Never Smoker  Substance Use Topics  . Alcohol use: Not on file  . Drug use: Not on file     Allergies   Patient has no known allergies.   Review of Systems Review of Systems  Constitutional: Positive for chills and fever (subjective).  HENT: Positive for congestion, rhinorrhea and sore throat. Negative for ear pain.   Respiratory: Positive for cough. Negative for shortness of breath.   Cardiovascular: Positive for chest pain ("muscle/bone pain"). Negative for leg swelling.  Gastrointestinal: Positive for nausea and vomiting. Negative for abdominal pain, blood in stool,  constipation and diarrhea.  Musculoskeletal: Positive for back pain.  Neurological: Negative for weakness and numbness.  All other systems reviewed and are negative.    Physical Exam Updated Vital Signs BP 140/75   Pulse (!) 59   Temp 99.8 F (37.7 C) (Oral)   Resp 18   SpO2 98%   Physical Exam  Constitutional: She appears well-developed and well-nourished.  Non-toxic appearance. No distress.  HENT:  Head: Normocephalic and atraumatic.  Right Ear: Tympanic membrane is not perforated, not erythematous, not retracted and not bulging.  Left Ear: Tympanic membrane is not perforated, not erythematous, not retracted and not bulging.  Nose: Mucosal edema (and congestion) present. Right sinus exhibits no maxillary sinus tenderness and no frontal sinus tenderness. Left sinus exhibits no maxillary sinus tenderness and no frontal sinus tenderness.  Mouth/Throat: Uvula is midline and oropharynx is clear and moist. No oropharyngeal exudate or posterior oropharyngeal erythema.  Eyes: Conjunctivae are normal. Pupils are equal, round, and reactive to light. Right eye exhibits no discharge. Left eye exhibits no discharge.  Neck: Normal range of motion. Neck supple.  Cardiovascular: Normal rate and regular rhythm.  No murmur heard. Pulses:      Radial pulses are 2+ on the right side, and 2+ on the left side.  Pulmonary/Chest: Effort normal. No respiratory distress. She has no wheezes. She has rhonchi (diffuse, more prominent left side). She has no rales. She exhibits tenderness (Bilateral anterior and posterior). She exhibits no crepitus, no edema, no deformity and no swelling.  Abdominal: Soft. She  exhibits no distension. There is no tenderness. There is no rigidity, no rebound and no guarding.  Musculoskeletal: She exhibits no edema.  No obvious deformity, appreciable swelling, erythema, or ecchymosis.  Back: No midline tenderness, bilateral paraspinal muscle tenderness to the lumbar  region. Extremities: Normal ROM, no point/focal tenderness.   Lymphadenopathy:    She has no cervical adenopathy.  Neurological: She is alert.  Skin: Skin is warm and dry. No rash noted.  Psychiatric: She has a normal mood and affect. Her behavior is normal.  Nursing note and vitals reviewed.  ED Treatments / Results  Labs (all labs ordered are listed, but only abnormal results are displayed) Labs Reviewed - No data to display  EKG  EKG Interpretation  Date/Time:  Wednesday June 24 2017 10:12:45 EST Ventricular Rate:  58 PR Interval:  136 QRS Duration: 82 QT Interval:  408 QTC Calculation: 400 R Axis:   40 Text Interpretation:  Sinus bradycardia Cannot rule out Anterior infarct , age undetermined Abnormal ECG No old tracing to compare Confirmed by Dorie Rank [phone]) on 06/24/2017 12:21:14 PM       Radiology Dg Chest 2 View  Result Date: 06/24/2017 CLINICAL DATA:  Productive cough, shortness of breath, chest tightness, and fever. Nonsmoker. EXAM: CHEST - 2 VIEW COMPARISON:  None in PACs FINDINGS: The lungs are well-expanded. The interstitial markings are mildly prominent. There is subtle increased density in the retrocardiac region. There is no pleural effusion. The heart and pulmonary vascularity are normal. There is calcification in the wall of the aortic arch. The bony thorax exhibits no acute abnormality. IMPRESSION: Chronic bronchitic-reactive airway changes. Patchy increased density in the retrocardiac region is difficult to triangulate on the frontal view. This may reflect atelectasis or early pneumonia. Followup PA and lateral chest X-ray is recommended in 3-4 weeks following trial of antibiotic therapy to ensure resolution and exclude underlying malignancy. Thoracic aortic atherosclerosis. Electronically Signed   By: David  Martinique M.D.   On: 06/24/2017 11:00    Procedures Procedures (including critical care time)  Medications Ordered in ED Medications - No data to  display   Initial Impression / Assessment and Plan / ED Course  I have reviewed the triage vital signs and the nursing notes.  Pertinent labs & imaging results that were available during my care of the patient were reviewed by me and considered in my medical decision making (see chart for details).   Patient presents with influenza like sxs. She is nontoxic appearing, in no apparent distress, noted to be mildly bradycardic and hypertensive- no indication of HTN emergency, patient aware. Given complaint of chest wall pain in triage EKG and CXR ordered and reviewed. EKG with sinus bradycardia- cannot rule out age undetermined infarct- patient's pain is not exertional pain, it began after a few days of coughing, it is generalized to entire body, and is reproducible with palpation- doubt ACS. Additionally- she is without leg swelling, hemoptysis, recent surgery/trauma, recent long travel, hormone use, personal hx of cancer, or hx of DVT/PE, doubt pulmonary embolism. Pain is not tearing, 2+ symmetric radial pulses, no widening of mediastinum on CXR, doubt dissection. CXR revealed findings suspicious for early pneumonia with recommended abx trial and repeat CXR. She has not had recent hospitalization, she is not immunocompromised or ill appearing. Patient does not have multiple comorbidities. Patient appropriate for outpatient tiral of abx. Will treat with doxycycline as well as supportive ibuprofen and Flonase. I discussed results, treatment plan, need for PCP follow-up and repeat CXR,  and return precautions with the patient. Provided opportunity for questions, patient confirmed understanding and is in agreement with plan.   Findings and plan of care discussed with supervising physician Dr. Tomi Bamberger who is in agreement with plan.   Final Clinical Impressions(s) / ED Diagnoses   Final diagnoses:  Community acquired pneumonia, unspecified laterality    ED Discharge Orders        Ordered    doxycycline  (VIBRAMYCIN) 100 MG capsule  2 times daily     06/24/17 1244    fluticasone (FLONASE) 50 MCG/ACT nasal spray  Daily     06/24/17 1244    ibuprofen (ADVIL,MOTRIN) 800 MG tablet  3 times daily     06/24/17 [address], [place] R, PA-C 06/24/17 1543    Dorie Rank, MD 06/26/17 1046

## 2017-06-24 NOTE — ED Triage Notes (Signed)
Patient complains of chest wall pain with cough since Sunday. Complains of body aches and chills with fever. NAD

## 2017-06-24 NOTE — ED Notes (Signed)
Pt verbalized understanding of d/c instructions, prescriptions, and f/u. VSS. Pt wheeled to lobby with all belongings

## 2017-06-24 NOTE — ED Notes (Signed)
ED Provider at bedside. 

## 2017-06-24 NOTE — Discharge Instructions (Signed)
You were seen in the emergency department today and found to have pneumonia on your chest x-ray.  As discussed we will treat you with an antibiotic, nasal spray, and ibuprofen to treat your symptoms.  -Doxycyline-this is the antibiotic you will need to take once in the morning and once in the evening.Please take all of your antibiotics until finished. You may develop abdominal discomfort or diarrhea from the antibiotic.  You may help offset this with probiotics which you can buy at the store (ask your pharmacist if unable to find) or get probiotics in the form of eating yogurt. Do not eat or take the probiotics until 2 hours after your antibiotic. If you are unable to tolerate these side effects follow-up with your primary care provider or return to the emergency department.   If you begin to experience any blistering, rashes, swelling, or difficulty breathing seek medical care for evaluation of potentially more serious side effects.   Please be aware that this medication may interact with other medications you are taking, please be sure to discuss your medication list with your pharmacist.   - Flonase-this is a nasal spray to help with congestion, use this in each nostril daily.  -Ibuprofen 800 mg-you may take this every 8 hours as needed for pain and fevers.  As discussed you will need to follow-up with your primary care doctor in 1 week for reevaluation of your symptoms.  You will also need a recheck of your blood pressure as this was somewhat high today in the emergency department.  If you do not have a primary care doctor a phone number encircled your discharge instructions that you may call to set up primary care.  It is also important that she did have a repeat chest x-ray in 3-4 weeks to ensure resolution of your pneumonia and ensure that nothing else is causing your symptoms.  Return to the emergency department for any new or worsening symptoms including but not limited to difficulty  breathing, worsening pain, inability to keep down fluids, or any other concerns that you may have.

## 2017-07-14 ENCOUNTER — Inpatient Hospital Stay (INDEPENDENT_AMBULATORY_CARE_PROVIDER_SITE_OTHER): Payer: Self-pay | Admitting: Physician Assistant

## 2018-03-11 ENCOUNTER — Other Ambulatory Visit: Payer: Self-pay

## 2018-03-11 ENCOUNTER — Encounter (INDEPENDENT_AMBULATORY_CARE_PROVIDER_SITE_OTHER): Payer: Self-pay | Admitting: Physician Assistant

## 2018-03-11 ENCOUNTER — Ambulatory Visit (INDEPENDENT_AMBULATORY_CARE_PROVIDER_SITE_OTHER): Payer: Medicare PPO | Admitting: Physician Assistant

## 2018-03-11 VITALS — BP 132/70 | HR 52 | Temp 97.8°F | Ht 67.0 in | Wt 188.4 lb

## 2018-03-11 DIAGNOSIS — R3915 Urgency of urination: Secondary | ICD-10-CM | POA: Diagnosis not present

## 2018-03-11 DIAGNOSIS — Z1239 Encounter for other screening for malignant neoplasm of breast: Secondary | ICD-10-CM

## 2018-03-11 DIAGNOSIS — R319 Hematuria, unspecified: Secondary | ICD-10-CM

## 2018-03-11 DIAGNOSIS — R350 Frequency of micturition: Secondary | ICD-10-CM | POA: Diagnosis not present

## 2018-03-11 DIAGNOSIS — K5900 Constipation, unspecified: Secondary | ICD-10-CM | POA: Diagnosis not present

## 2018-03-11 DIAGNOSIS — M25562 Pain in left knee: Secondary | ICD-10-CM

## 2018-03-11 DIAGNOSIS — M25561 Pain in right knee: Secondary | ICD-10-CM

## 2018-03-11 DIAGNOSIS — Z79899 Other long term (current) drug therapy: Secondary | ICD-10-CM

## 2018-03-11 DIAGNOSIS — Z1159 Encounter for screening for other viral diseases: Secondary | ICD-10-CM

## 2018-03-11 DIAGNOSIS — Z131 Encounter for screening for diabetes mellitus: Secondary | ICD-10-CM | POA: Diagnosis not present

## 2018-03-11 DIAGNOSIS — Z1211 Encounter for screening for malignant neoplasm of colon: Secondary | ICD-10-CM

## 2018-03-11 DIAGNOSIS — N390 Urinary tract infection, site not specified: Secondary | ICD-10-CM

## 2018-03-11 DIAGNOSIS — G8929 Other chronic pain: Secondary | ICD-10-CM

## 2018-03-11 LAB — POCT URINALYSIS DIPSTICK
BILIRUBIN UA: NEGATIVE
GLUCOSE UA: NEGATIVE
Ketones, UA: NEGATIVE
Nitrite, UA: POSITIVE
Protein, UA: NEGATIVE
SPEC GRAV UA: 1.02 (ref 1.010–1.025)
Urobilinogen, UA: 0.2 E.U./dL
pH, UA: 6.5 (ref 5.0–8.0)

## 2018-03-11 LAB — POCT GLYCOSYLATED HEMOGLOBIN (HGB A1C): Hemoglobin A1C: 4.8 % (ref 4.0–5.6)

## 2018-03-11 MED ORDER — SENNOSIDES-DOCUSATE SODIUM 8.6-50 MG PO TABS: 1.0000 | ORAL_TABLET | Freq: Every day | ORAL | 0 refills | Status: AC

## 2018-03-11 MED ORDER — CIPROFLOXACIN HCL 500 MG PO TABS: 500.0000 mg | ORAL_TABLET | Freq: Two times a day (BID) | ORAL | 0 refills | Status: AC

## 2018-03-11 NOTE — Progress Notes (Signed)
Subjective:  Patient ID: Betty Mercer, female    DOB: [birth year]  Age: 67 y.o. MRN: [id number]  CC:   HPI Betty Mercer is a 67 y.o. female with a medical history of CAP presents as a new patient with concern for urinary frequency, urinary urgency, bilateral lower back pain, bilateral LE edema, bilateral foot numbness/tingling, visual blurring, and constipation. Says all symptoms have been present for four years. Says she does not like going to see doctors. Has not taken anything for relief. Does not endorse CP, palpitations, SOB, HA, abdominal pain, f/c/n/v, or rash.    Outpatient Medications Prior to Visit  Medication Sig Dispense Refill  . doxycycline (VIBRAMYCIN) 100 MG capsule Take 1 capsule (100 mg total) by mouth 2 (two) times daily. 14 capsule 0  . fluticasone (FLONASE) 50 MCG/ACT nasal spray Place 1 spray into both nostrils daily. 16 g 2  . ibuprofen (ADVIL,MOTRIN) 800 MG tablet Take 1 tablet (800 mg total) by mouth 3 (three) times daily. 30 tablet 0   No facility-administered medications prior to visit.      ROS Review of Systems  Constitutional: Negative for chills, fever and malaise/fatigue.  Eyes: Positive for blurred vision.  Respiratory: Negative for shortness of breath.   Cardiovascular: Negative for chest pain and palpitations.  Gastrointestinal: Positive for constipation. Negative for abdominal pain and nausea.  Genitourinary: Positive for frequency and urgency. Negative for dysuria and hematuria.  Musculoskeletal: Positive for back pain. Negative for joint pain and myalgias.  Skin: Negative for rash.  Neurological: Positive for tingling. Negative for headaches.  Psychiatric/Behavioral: Negative for depression. The patient is not nervous/anxious.     Objective:  There were no vitals taken for this visit.  BP/Weight 06/24/2017 5/64/3329  Systolic BP 518 841  Diastolic BP 92 76  Wt. (Lbs) - 210  BMI - 32.88      Physical Exam  Constitutional: She  is oriented to person, place, and time.  Well developed, well nourished, NAD, polite  HENT:  Head: Normocephalic and atraumatic.  Eyes: No scleral icterus.  Neck: Normal range of motion. Neck supple. No thyromegaly present.  Cardiovascular: Normal rate, regular rhythm and normal heart sounds.  No LE edema  Pulmonary/Chest: Effort normal and breath sounds normal.  Musculoskeletal: She exhibits no edema.  Neurological: She is alert and oriented to person, place, and time.  Skin: Skin is warm and dry. No rash noted. No erythema. No pallor.  Psychiatric: She has a normal mood and affect. Her behavior is normal. Thought content normal.  Vitals reviewed.    Assessment & Plan:    1. Urinary frequency - Urinalysis Dipstick with pos nitrites and pos leukocytes - Urine Culture - Begin ciprofloxacin (CIPRO) 500 MG tablet; Take 1 tablet (500 mg total) by mouth 2 (two) times daily for 5 days.  Dispense: 10 tablet; Refill: 0  2. Urinary urgency - Urinalysis Dipstick - Urine Culture - Begin ciprofloxacin (CIPRO) 500 MG tablet; Take 1 tablet (500 mg total) by mouth 2 (two) times daily for 5 days.  Dispense: 10 tablet; Refill: 0  3. Chronic pain of both knees - DG Knee Complete 4 Views Right; Future - DG Knee Complete 4 Views Left; Future  4. Constipation, unspecified constipation type - Begin Docusate-Senna  5. Screening for diabetes mellitus - HgB A1c  6. Need for hepatitis C screening test - Hepatitis c antibody (reflex)  7. Special screening for malignant neoplasms, colon - Ambulatory referral to Gastroenterology  8. Screening for breast cancer -  MM DIGITAL SCREENING BILATERAL; Future  9. Urinary tract infection with hematuria, site unspecified - Urine Culture - ciprofloxacin (CIPRO) 500 MG tablet; Take 1 tablet (500 mg total) by mouth 2 (two) times daily for 5 days.  Dispense: 10 tablet; Refill: 0   Meds ordered this encounter  Medications  . ciprofloxacin (CIPRO) 500 MG  tablet    Sig: Take 1 tablet (500 mg total) by mouth 2 (two) times daily for 5 days.    Dispense:  10 tablet    Refill:  0    Order Specific Question:   Supervising Provider    Answer:   Charlott Rakes [[id number]]  . senna-docusate (SENOKOT-S) 8.6-50 MG tablet    Sig: Take 1 tablet by mouth daily for 5 days.    Dispense:  5 tablet    Refill:  0    Order Specific Question:   Supervising Provider    Answer:   Charlott Rakes [[id number]]    Follow-up: 4 weeks for UTI f/u and for general health maintenance  Clent Demark PA

## 2018-03-11 NOTE — Patient Instructions (Signed)

## 2018-03-12 ENCOUNTER — Telehealth (INDEPENDENT_AMBULATORY_CARE_PROVIDER_SITE_OTHER): Payer: Self-pay

## 2018-03-12 LAB — HEPATITIS C ANTIBODY (REFLEX): HCV Ab: <0.1 s/co ratio (ref 0.0–0.9)

## 2018-03-12 LAB — BASIC METABOLIC PANEL
BUN/Creatinine Ratio: 29 — ABNORMAL HIGH (ref 12–28)
BUN: 22 mg/dL (ref 8–27)
CALCIUM: 9.5 mg/dL (ref 8.7–10.3)
CO2: 23 mmol/L (ref 20–29)
Chloride: 107 mmol/L — ABNORMAL HIGH (ref 96–106)
Creatinine, Ser: 0.76 mg/dL (ref 0.57–1.00)
GFR calc Af Amer: 95 mL/min/{1.73_m2} (ref >59)
GFR, EST NON AFRICAN AMERICAN: 83 mL/min/{1.73_m2} (ref >59)
Glucose: 95 mg/dL (ref 65–99)
POTASSIUM: 4.3 mmol/L (ref 3.5–5.2)
Sodium: 145 mmol/L — ABNORMAL HIGH (ref 134–144)

## 2018-03-12 LAB — HCV COMMENT:

## 2018-03-12 NOTE — Telephone Encounter (Signed)
Patient is aware that HCV is negative. Labs show signs of dehydration, advised patient to drink more water. Nat Christen, CMA

## 2018-03-12 NOTE — Telephone Encounter (Signed)
-----   Message from Clent Demark, PA-C sent at 03/12/2018  8:41 AM EST ----- Negative HCV. Signs of dehydration. Drink more water.

## 2018-03-13 LAB — URINE CULTURE

## 2018-03-15 ENCOUNTER — Telehealth (INDEPENDENT_AMBULATORY_CARE_PROVIDER_SITE_OTHER): Payer: Self-pay | Admitting: *Deleted

## 2018-03-15 NOTE — Telephone Encounter (Signed)
Patient verified DOB Patient does still complain of UA symptoms and will contact the office to schedule a nurse visit to have urine tested again.

## 2018-03-15 NOTE — Telephone Encounter (Signed)
-----   Message from Clent Demark, PA-C sent at 03/15/2018  9:57 AM EST ----- Greater than 2 organisms recovered, none predominant. She may resubmit urine if still having symptoms. Let me know if I will need to order another UA and urine cx. Thanks.

## 2018-04-06 ENCOUNTER — Ambulatory Visit (INDEPENDENT_AMBULATORY_CARE_PROVIDER_SITE_OTHER): Payer: Medicare PPO | Admitting: Physician Assistant

## 2018-04-08 ENCOUNTER — Ambulatory Visit (INDEPENDENT_AMBULATORY_CARE_PROVIDER_SITE_OTHER): Payer: Medicare PPO | Admitting: Physician Assistant

## 2018-04-28 ENCOUNTER — Ambulatory Visit
Admission: RE | Admit: 2018-04-28 | Discharge: 2018-04-28 | Disposition: A | Discharge status: Home / Self Care | Payer: Medicare PPO | Source: Ambulatory Visit | Attending: Physician Assistant | Admitting: Physician Assistant

## 2018-04-28 DIAGNOSIS — Z1239 Encounter for other screening for malignant neoplasm of breast: Secondary | ICD-10-CM

## 2018-05-04 ENCOUNTER — Ambulatory Visit (INDEPENDENT_AMBULATORY_CARE_PROVIDER_SITE_OTHER): Payer: Medicare PPO | Admitting: Family Medicine

## 2018-05-10 ENCOUNTER — Encounter (HOSPITAL_COMMUNITY): Payer: Self-pay | Admitting: Emergency Medicine

## 2018-05-18 ENCOUNTER — Ambulatory Visit (INDEPENDENT_AMBULATORY_CARE_PROVIDER_SITE_OTHER): Payer: Medicare PPO | Admitting: Family Medicine

## 2018-05-18 ENCOUNTER — Encounter (INDEPENDENT_AMBULATORY_CARE_PROVIDER_SITE_OTHER): Payer: Self-pay | Admitting: Family Medicine

## 2018-05-18 VITALS — BP 151/65 | HR 51 | Temp 97.7°F | Ht 67.0 in | Wt 191.0 lb

## 2018-05-18 DIAGNOSIS — R5383 Other fatigue: Secondary | ICD-10-CM

## 2018-05-18 DIAGNOSIS — N3000 Acute cystitis without hematuria: Secondary | ICD-10-CM

## 2018-05-18 DIAGNOSIS — Z87898 Personal history of other specified conditions: Secondary | ICD-10-CM | POA: Insufficient documentation

## 2018-05-18 DIAGNOSIS — Z862 Personal history of diseases of the blood and blood-forming organs and certain disorders involving the immune mechanism: Secondary | ICD-10-CM | POA: Insufficient documentation

## 2018-05-18 DIAGNOSIS — M25561 Pain in right knee: Secondary | ICD-10-CM

## 2018-05-18 DIAGNOSIS — D649 Anemia, unspecified: Secondary | ICD-10-CM | POA: Diagnosis not present

## 2018-05-18 DIAGNOSIS — R3915 Urgency of urination: Secondary | ICD-10-CM | POA: Diagnosis not present

## 2018-05-18 DIAGNOSIS — M25562 Pain in left knee: Secondary | ICD-10-CM

## 2018-05-18 DIAGNOSIS — Z1211 Encounter for screening for malignant neoplasm of colon: Secondary | ICD-10-CM

## 2018-05-18 DIAGNOSIS — G8929 Other chronic pain: Secondary | ICD-10-CM | POA: Insufficient documentation

## 2018-05-18 DIAGNOSIS — D259 Leiomyoma of uterus, unspecified: Secondary | ICD-10-CM | POA: Insufficient documentation

## 2018-05-18 LAB — POCT URINALYSIS DIPSTICK
Bilirubin, UA: NEGATIVE
Blood, UA: NEGATIVE
Glucose, UA: NEGATIVE
Ketones, UA: NEGATIVE
Nitrite, UA: POSITIVE
Protein, UA: POSITIVE — AB
Spec Grav, UA: 1.025
Urobilinogen, UA: 2 U/dL — AB
pH, UA: 7

## 2018-05-18 MED ORDER — SULFAMETHOXAZOLE-TRIMETHOPRIM 800-160 MG PO TABS: 1.0000 | ORAL_TABLET | Freq: Two times a day (BID) | ORAL | 0 refills | Status: AC

## 2018-05-18 NOTE — Progress Notes (Signed)
Subjective:    Patient ID: Betty Mercer, female    DOB: 12/16/1950, 68 y.o.   MRN: [id number]  HPI      68 year old female who is seen secondary to the complaint of increasing urinary incontinence.  Patient states that now when she gets the sudden urge to urinate she can no longer hold her urine and actually has some leakage.  Patient also feels as though she is urinating frequently but this is been going on for months.  Patient denies any abdominal pain.  Patient has some mid to low back pain but has not felt as if she is having any recent increase.  Patient states that she recalls having some type of imaging or test at the emergency room in the past which included her kidneys.  Patient does feel that she has daily nausea but again this has been going on for a while.  Patient denies any fever or chills.  No headache or dizziness.  Patient denies any dysuria.  Patient states that she is also feeling more fatigued as she does have a past history of anemia and would like to have CBC done at today's visit in follow-up to see if she needs iron pills.  Patient reports no known drug allergies.  Other than the anemia, patient denies any other significant medical issues.  Patient does not smoke but states that she does have several alcoholic drinks per week.  Patient denies any prior surgery.             Review of Systems  Constitutional: Positive for fatigue. Negative for chills and fever.  Respiratory: Negative for cough and shortness of breath.   Cardiovascular: Negative for chest pain, palpitations and leg swelling.  Gastrointestinal: Positive for nausea. Negative for abdominal pain, constipation and diarrhea.  Endocrine: Positive for polyuria. Negative for polydipsia and polyphagia.  Genitourinary: Positive for frequency and urgency. Negative for dysuria.  Musculoskeletal: Positive for arthralgias (occasional knee pain). Negative for back pain and gait problem.  Neurological:  Negative for dizziness and headaches.  Hematological: Negative for adenopathy. Does not bruise/bleed easily.       Objective:   Physical Exam BP (!) 151/65 (BP Location: Left Arm, Patient Position: Sitting, Cuff Size: Large)   Pulse (!) 51   Temp 97.7 F (36.5 C) (Oral)   Ht 5\' 7"  (1.702 m)   Wt 191 lb (86.6 kg)   SpO2 96%   BMI 29.91 kg/m Nurses note and vital signs reviewed General-well-nourished, well-developed older female in no acute distress Lungs-clear to auscultation bilaterally Cardiovascular-regular rate and rhythm Back-no CVA tenderness, patient does have some mild thoracolumbar paraspinous spasm Abdomen-mild truncal obesity, mild abdominal distention, abdomen is soft and nontender.  No rebound or guarding, no reproducible abdominal pain Extremities-no edema        Assessment & Plan:  1. Urinary urgency Patient with complaint of urinary urgency, frequency and worsening of urinary incontinence.  Patient did have abnormal urinalysis and abnormal urine culture at her visit in November 2019.  Patient will have urinalysis repeated at today's visit to look for signs of urinary tract infection. - Urinalysis Dipstick   2. Acute cystitis without hematuria Patient with abnormal urinalysis with leukocytes and nitrites.  Patient will be placed on Septra double strength twice daily x7 days and urine will be sent for culture.  Patient will be notified if a change is needed in the antibiotic based on the urine culture results.  Patient has been asked to return to  clinic in the next 3 weeks to have repeat of urine to make sure that the infection is cleared and patient will also have recheck of blood pressure at that time as blood pressure was slightly elevated at today's visit.  Patient did have prior CT scan done in approximately 2014 which showed a large uterine fibroid which was exerting some pressure against the urinary bladder.  This may be contributing to patient's recurrent  UTIs. - Urine Culture pending  3. Normocytic anemia Patient's last CBC on review of chart was April 25, 2012 with hemoglobin of 11.3 and normal MCV at 83.9.  Patient reports recent increase in fatigue and would like to have follow-up of anemia.  Patient will have CBC done at today's visit.  Patient was also provided with fecal occult stool cards for testing - CBC with Differential  4. Fatigue, unspecified type Patient had urinalysis done in follow-up of fatigue and this was abnormal therefore UTI may be contributing to her fatigue but patient also reports a history of anemia and patient will have CBC done at today's visit. - CBC with Differential  An After Visit Summary was printed and given to the patient.  Return in about 3 weeks (around 06/08/2018) for f/u UTI.

## 2018-05-19 LAB — CBC WITH DIFFERENTIAL/PLATELET
Basophils Absolute: 0 x10E3/uL (ref 0.0–0.2)
Basos: 1 %
EOS (ABSOLUTE): 0 x10E3/uL (ref 0.0–0.4)
Eos: 1 %
Hematocrit: 32.7 % — ABNORMAL LOW (ref 34.0–46.6)
Hemoglobin: 10.7 g/dL — ABNORMAL LOW (ref 11.1–15.9)
Immature Grans (Abs): 0 x10E3/uL (ref 0.0–0.1)
Immature Granulocytes: 0 %
Lymphocytes Absolute: 1.7 x10E3/uL (ref 0.7–3.1)
Lymphs: 37 %
MCH: 26.6 pg (ref 26.6–33.0)
MCHC: 32.7 g/dL (ref 31.5–35.7)
MCV: 81 fL (ref 79–97)
Monocytes Absolute: 0.3 x10E3/uL (ref 0.1–0.9)
Monocytes: 7 %
Neutrophils Absolute: 2.6 x10E3/uL (ref 1.4–7.0)
Neutrophils: 54 %
Platelets: 296 x10E3/uL (ref 150–450)
RBC: 4.02 x10E6/uL (ref 3.77–5.28)
RDW: 13.2 % (ref 11.7–15.4)
WBC: 4.8 x10E3/uL (ref 3.4–10.8)

## 2018-05-23 ENCOUNTER — Encounter: Payer: Self-pay | Admitting: Family Medicine

## 2018-05-23 LAB — URINE CULTURE

## 2018-05-24 ENCOUNTER — Telehealth (INDEPENDENT_AMBULATORY_CARE_PROVIDER_SITE_OTHER): Payer: Self-pay

## 2018-05-24 NOTE — Telephone Encounter (Signed)
Left voicemail notifying patient that urine culture showed growth of E coli which is sensitive to the antibiotic prescribed at visit. CBC shows the Hgb slightly decreased from 11.3 to 10.7. advised patient to take iron supplements as directed. Return call to [facility] with any questions or concerns. Nat Christen, CMA

## 2018-05-24 NOTE — Telephone Encounter (Signed)
-----   Message from Antony Blackbird, MD sent at 05/23/2018  9:40 PM EST ----- Please notify patient that her urine culture showed the growth of bacteria- E. Coli which was sensitive to the antibiotic, Septra, that she was prescribed. CBC showed a slight drop in Hgb down to 10.7 from prior 11.3. She should make sure that she is taking an iron supplement

## 2018-05-25 ENCOUNTER — Ambulatory Visit (INDEPENDENT_AMBULATORY_CARE_PROVIDER_SITE_OTHER): Payer: Medicare PPO | Admitting: Family Medicine

## 2018-05-31 ENCOUNTER — Ambulatory Visit (INDEPENDENT_AMBULATORY_CARE_PROVIDER_SITE_OTHER): Payer: Medicare PPO | Admitting: Internal Medicine

## 2018-07-18 ENCOUNTER — Encounter (INDEPENDENT_AMBULATORY_CARE_PROVIDER_SITE_OTHER): Payer: Self-pay | Admitting: Family Medicine

## 2018-11-15 DIAGNOSIS — Z03818 Encounter for observation for suspected exposure to other biological agents ruled out: Secondary | ICD-10-CM | POA: Diagnosis not present

## 2019-01-06 ENCOUNTER — Emergency Department (HOSPITAL_COMMUNITY)
Admission: EM | Admit: 2019-01-06 | Discharge: 2019-01-06 | Disposition: A | Discharge status: Home / Self Care | Payer: Medicare PPO | Attending: Emergency Medicine | Admitting: Emergency Medicine

## 2019-01-06 ENCOUNTER — Other Ambulatory Visit: Payer: Self-pay

## 2019-01-06 ENCOUNTER — Encounter (HOSPITAL_COMMUNITY): Payer: Self-pay | Admitting: Emergency Medicine

## 2019-01-06 DIAGNOSIS — T783XXA Angioneurotic edema, initial encounter: Secondary | ICD-10-CM | POA: Insufficient documentation

## 2019-01-06 LAB — COMPREHENSIVE METABOLIC PANEL
ALT: 13 U/L (ref 0–44)
AST: 17 U/L (ref 15–41)
Albumin: 3.5 g/dL (ref 3.5–5.0)
Alkaline Phosphatase: 44 U/L (ref 38–126)
Anion gap: 13 (ref 5–15)
BUN: 16 mg/dL (ref 8–23)
CO2: 21 mmol/L — ABNORMAL LOW (ref 22–32)
Calcium: 9 mg/dL (ref 8.9–10.3)
Chloride: 106 mmol/L (ref 98–111)
Creatinine, Ser: 0.86 mg/dL (ref 0.44–1.00)
GFR calc Af Amer: >60 mL/min (ref >60)
GFR calc non Af Amer: >60 mL/min (ref >60)
Glucose, Bld: 109 mg/dL — ABNORMAL HIGH (ref 70–99)
Potassium: 3.7 mmol/L (ref 3.5–5.1)
Sodium: 140 mmol/L (ref 135–145)
Total Bilirubin: 0.5 mg/dL (ref 0.3–1.2)
Total Protein: 7.5 g/dL (ref 6.5–8.1)

## 2019-01-06 LAB — CBC WITH DIFFERENTIAL/PLATELET
Abs Immature Granulocytes: 0.01 10*3/uL (ref 0.00–0.07)
Basophils Absolute: 0 10*3/uL (ref 0.0–0.1)
Basophils Relative: 1 %
Eosinophils Absolute: 0.1 10*3/uL (ref 0.0–0.5)
Eosinophils Relative: 1 %
HCT: 34.2 % — ABNORMAL LOW (ref 36.0–46.0)
Hemoglobin: 11.1 g/dL — ABNORMAL LOW (ref 12.0–15.0)
Immature Granulocytes: 0 %
Lymphocytes Relative: 33 %
Lymphs Abs: 1.7 10*3/uL (ref 0.7–4.0)
MCH: 27.2 pg (ref 26.0–34.0)
MCHC: 32.5 g/dL (ref 30.0–36.0)
MCV: 83.8 fL (ref 80.0–100.0)
Monocytes Absolute: 0.4 10*3/uL (ref 0.1–1.0)
Monocytes Relative: 9 %
Neutro Abs: 2.9 10*3/uL (ref 1.7–7.7)
Neutrophils Relative %: 56 %
Platelets: 289 10*3/uL (ref 150–400)
RBC: 4.08 MIL/uL (ref 3.87–5.11)
RDW: 14.9 % (ref 11.5–15.5)
WBC: 5.2 10*3/uL (ref 4.0–10.5)
nRBC: 0 % (ref 0.0–0.2)

## 2019-01-06 MED ORDER — PREDNISONE 20 MG PO TABS: ORAL_TABLET | ORAL | 0 refills | Status: DC

## 2019-01-06 MED ORDER — DIPHENHYDRAMINE HCL 25 MG PO CAPS
50.0000 mg | ORAL_CAPSULE | Freq: Once | ORAL | Status: AC
  Administered 2019-01-06: 02:00:00 50 mg via ORAL
  Filled 2019-01-06: qty 2

## 2019-01-06 MED ORDER — PREDNISONE 20 MG PO TABS
60.0000 mg | ORAL_TABLET | Freq: Once | ORAL | Status: AC
  Administered 2019-01-06: 02:00:00 60 mg via ORAL
  Filled 2019-01-06: qty 3

## 2019-01-06 MED ORDER — FAMOTIDINE 20 MG PO TABS
20.0000 mg | ORAL_TABLET | Freq: Once | ORAL | Status: AC
  Administered 2019-01-06: 20 mg via ORAL
  Filled 2019-01-06: qty 1

## 2019-01-06 NOTE — ED Notes (Signed)
She feels like her tongue is smaller

## 2019-01-06 NOTE — ED Notes (Signed)
Very littlr change

## 2019-01-06 NOTE — Discharge Instructions (Signed)
Return for worsening swelling.  Your family doc may want to send you to an allergist for testing as this has happened to you twice now.

## 2019-01-06 NOTE — ED Triage Notes (Signed)
Patient presents with angioedema at tongue with swelling , hard to speak/swallow this evening , respirations unlabored , no fever or chills .

## 2019-01-06 NOTE — ED Notes (Signed)
The pts tongue is slowly returning to normal size

## 2019-01-06 NOTE — ED Provider Notes (Signed)
[facility] EMERGENCY DEPARTMENT Provider Note   CSN: [id number] Arrival date & time: 01/06/19  0058     History   Chief Complaint Chief Complaint  Patient presents with  . Angioedema    Tongue Swelling    HPI Betty Mercer is a 68 y.o. female.     69 yo F with a chief complaint of tongue swelling.  Started about 5 hours ago.  Patient has a history of similar things happening to her.  No known allergies.  She thinks she may have an allergy to cherries because this happened previously when she ate cherries as well.  She denies rash denies wheezing denies vomiting or diarrhea.  Denies feeling like she may pass out.  Denies swelling other than her tongue.  Is having some trouble phonating.  The history is provided by the patient.  Illness Severity:  Moderate Onset quality:  Gradual Duration:  5 hours Timing:  Constant Progression:  Worsening Chronicity:  Recurrent Associated symptoms: no chest pain, no congestion, no fever, no headaches, no myalgias, no nausea, no rhinorrhea, no shortness of breath, no vomiting and no wheezing     History reviewed. No pertinent past medical history.  Patient Active Problem List   Diagnosis Date Noted  . Bilateral chronic knee pain 05/18/2018  . History of anemia 05/18/2018  . Uterine fibroid 05/18/2018  . History of angioedema 05/18/2018    History reviewed. No pertinent surgical history.   OB History   No obstetric history on file.      Home Medications    Prior to Admission medications   Medication Sig Start Date End Date Taking? Authorizing Provider  predniSONE (DELTASONE) 20 MG tablet 2 tabs po daily x 4 days 01/06/19   Deno Etienne, DO    Family History Family History  Problem Relation Age of Onset  . Breast cancer Sister 51    Social History Social History   Tobacco Use  . Smoking status: Never Smoker  . Smokeless tobacco: Never Used  Substance Use Topics  . Alcohol use: Yes   . Drug use: No     Allergies   Patient has no known allergies.   Review of Systems Review of Systems  Constitutional: Negative for chills and fever.  HENT: Positive for facial swelling and trouble swallowing. Negative for congestion and rhinorrhea.   Eyes: Negative for redness and visual disturbance.  Respiratory: Negative for shortness of breath and wheezing.   Cardiovascular: Negative for chest pain and palpitations.  Gastrointestinal: Negative for nausea and vomiting.  Genitourinary: Negative for dysuria and urgency.  Musculoskeletal: Negative for arthralgias and myalgias.  Skin: Negative for pallor and wound.  Neurological: Negative for dizziness and headaches.     Physical Exam Updated Vital Signs BP 132/79   Pulse (!) 53   Temp 98.9 F (37.2 C) (Oral)   Resp 18   SpO2 98%   Physical Exam Vitals signs and nursing note reviewed.  Constitutional:      General: She is not in acute distress.    Appearance: She is well-developed. She is not diaphoretic.  HENT:     Head: Normocephalic and atraumatic.     Comments: Marked tongue swelling.  No appreciable swelling to the lips.  Posterior pharynx without erythema or edema.  Tolerating secretions without difficulty.  Able to rotate her head 45 degrees in either direction without discomfort.  No sublingual swelling. Eyes:     Pupils: Pupils are equal, round, and reactive to  light.  Neck:     Musculoskeletal: Normal range of motion and neck supple.  Cardiovascular:     Rate and Rhythm: Normal rate and regular rhythm.     Heart sounds: No murmur. No friction rub. No gallop.   Pulmonary:     Effort: Pulmonary effort is normal.     Breath sounds: No wheezing or rales.  Abdominal:     General: There is no distension.     Palpations: Abdomen is soft.     Tenderness: There is no abdominal tenderness.  Musculoskeletal:        General: No tenderness.  Skin:    General: Skin is warm and dry.  Neurological:     Mental  Status: She is alert and oriented to person, place, and time.  Psychiatric:        Behavior: Behavior normal.      ED Treatments / Results  Labs (all labs ordered are listed, but only abnormal results are displayed) Labs Reviewed  CBC WITH DIFFERENTIAL/PLATELET - Abnormal; Notable for the following components:      Result Value   Hemoglobin 11.1 (*)    HCT 34.2 (*)    All other components within normal limits  COMPREHENSIVE METABOLIC PANEL - Abnormal; Notable for the following components:   CO2 21 (*)    Glucose, Bld 109 (*)    All other components within normal limits    EKG None  Radiology No results found.  Procedures Procedures (including critical care time)  Medications Ordered in ED Medications  diphenhydrAMINE (BENADRYL) capsule 50 mg (50 mg Oral Given 01/06/19 0138)  predniSONE (DELTASONE) tablet 60 mg (60 mg Oral Given 01/06/19 0138)  famotidine (PEPCID) tablet 20 mg (20 mg Oral Given 01/06/19 0138)     Initial Impression / Assessment and Plan / ED Course  I have reviewed the triage vital signs and the nursing notes.  Pertinent labs & imaging results that were available during my care of the patient were reviewed by me and considered in my medical decision making (see chart for details).        68 yo F with a chief complaint of angioedema.  Going on for about 4 hours.  Slowly worsening.  No issues breathing.  Not drooling.  Will give steroids Pepcid Benadryl and observe in the ED.  Patient with some improvement.  Will continue steroids.  PCP follow up for possible allergy referral.   4:55 AM:  I have discussed the diagnosis/risks/treatment options with the patient and believe the pt to be eligible for discharge home to follow-up with PCP. We also discussed returning to the ED immediately if new or worsening sx occur. We discussed the sx which are most concerning (e.g., worsening swelling, sob, rash, wheezing, vomiting, diarrhea, near syncope) that necessitate  immediate return. Medications administered to the patient during their visit and any new prescriptions provided to the patient are listed below.  Medications given during this visit Medications  diphenhydrAMINE (BENADRYL) capsule 50 mg (50 mg Oral Given 01/06/19 0138)  predniSONE (DELTASONE) tablet 60 mg (60 mg Oral Given 01/06/19 0138)  famotidine (PEPCID) tablet 20 mg (20 mg Oral Given 01/06/19 0138)     The patient appears reasonably screen and/or stabilized for discharge and I doubt any other medical condition or other [facility] requiring further screening, evaluation, or treatment in the ED at this time prior to discharge.    Final Clinical Impressions(s) / ED Diagnoses   Final diagnoses:  Angioedema, initial encounter  ED Discharge Orders         Ordered    predniSONE (DELTASONE) 20 MG tablet     01/06/19 Hazard, Bell, DO 01/06/19 [phone]

## 2019-01-13 ENCOUNTER — Telehealth (INDEPENDENT_AMBULATORY_CARE_PROVIDER_SITE_OTHER): Payer: Medicare PPO | Admitting: Primary Care

## 2019-03-03 ENCOUNTER — Ambulatory Visit (INDEPENDENT_AMBULATORY_CARE_PROVIDER_SITE_OTHER): Payer: Medicare PPO | Admitting: Primary Care

## 2019-03-03 ENCOUNTER — Encounter (INDEPENDENT_AMBULATORY_CARE_PROVIDER_SITE_OTHER): Payer: Self-pay | Admitting: Primary Care

## 2019-03-03 ENCOUNTER — Other Ambulatory Visit: Payer: Self-pay

## 2019-03-03 VITALS — BP 138/79 | HR 68 | Temp 97.3°F | Ht 67.0 in | Wt 183.2 lb

## 2019-03-03 DIAGNOSIS — Z13220 Encounter for screening for lipoid disorders: Secondary | ICD-10-CM

## 2019-03-03 DIAGNOSIS — Z1211 Encounter for screening for malignant neoplasm of colon: Secondary | ICD-10-CM

## 2019-03-03 DIAGNOSIS — K5900 Constipation, unspecified: Secondary | ICD-10-CM

## 2019-03-03 DIAGNOSIS — R030 Elevated blood-pressure reading, without diagnosis of hypertension: Secondary | ICD-10-CM

## 2019-03-03 DIAGNOSIS — H2513 Age-related nuclear cataract, bilateral: Secondary | ICD-10-CM | POA: Diagnosis not present

## 2019-03-03 DIAGNOSIS — T7840XA Allergy, unspecified, initial encounter: Secondary | ICD-10-CM

## 2019-03-03 DIAGNOSIS — H25043 Posterior subcapsular polar age-related cataract, bilateral: Secondary | ICD-10-CM | POA: Diagnosis not present

## 2019-03-03 MED ORDER — EPINEPHRINE 0.3 MG/0.3ML IJ SOAJ: 0.3000 mg | INTRAMUSCULAR | 1 refills | Status: AC | PRN

## 2019-03-03 MED ORDER — LINACLOTIDE 72 MCG PO CAPS: 72.0000 ug | ORAL_CAPSULE | Freq: Every day | ORAL | 3 refills | Status: DC

## 2019-03-03 NOTE — Patient Instructions (Addendum)
Anaphylactic Reaction, Adult An anaphylactic reaction (anaphylaxis) is a sudden, serious allergic reaction. This affects more than one part of your body. It can be life-threatening. If you have an anaphylactic reaction, you need to get medical help right away. What are the causes? This condition is caused by exposure to things that give you an allergic reaction (allergens). Common allergens include:  Foods, such as peanuts, wheat, shellfish, milk, and eggs.  Medicines.  Insect bites or stings.  Blood or parts of blood received for treatment (transfusions).  Chemicals, such as latex and dyes that are used in food and in medical tests. What are the signs or symptoms? Signs of an anaphylactic reaction may include:  Feeling warm in the face (flushed). Your face may turn red.  Itchy, red, swollen areas of skin (hives).  Swelling of the: ? Eyes. ? Lips. ? Face. ? Mouth. ? Tongue. ? Throat.  Trouble with any of these: ? Breathing. ? Talking. ? Swallowing.  Loud breathing (wheezing).  Feeling dizzy or light-headed.  Passing out (fainting).  Pain or cramps in your belly.  Throwing up (vomiting).  Watery poop (diarrhea). How is this diagnosed? This condition is diagnosed based on:  Your symptoms.  A physical exam.  Blood tests.  Recent exposure to things that give you an allergic reaction. How is this treated? If you think you are having an anaphylactic reaction, you should do this right away:  Give yourself a shot of medicine (epinephrine) using an auto-injector "pen." Your doctor will teach you how to use this pen.  Call for emergency help. If you use a pen, you must still get treated in the hospital. There, you may be given: ? Medicines. ? Oxygen. ? Fluids in an IV tube. Follow these instructions at home: Safety  Always keep an auto-injector pen with you. This could save your life. Use it as told by your doctor.  Do not drive after a reaction. Wait until  your doctor says it is safe to drive.  Make sure that you, the people who live with you, and your employer know: ? What you are allergic to, so you can stay away from it. ? How to use your auto-injector pen.  Wear a bracelet or necklace that says you have an allergy, if your doctor tells you to do this.  Learn the signs of a very bad allergic reaction. This way, you can treat it right away.  Work with your doctors to make a plan for what to do if you have a very bad reaction. It is important to be ready. If you use your auto-injector pen:   Get more medicine (epinephrine) for your pen right away. This is important in case you have another reaction.  Get help right away. To avoid a serious allergic reaction:  Avoid things that gave you a very bad allergic reaction before.  Tell your server about your allergy when you go out to eat. If you are not sure if your meal has food that you are allergic to, ask your server before you eat it. General instructions  Take over-the-counter and prescription medicines only as told by your doctor.  If you have itchy, red, swollen areas of skin or a rash: ? Use an over-the-counter medicine (antihistamine) as told by your doctor. ? Put cold, wet cloths on your skin. ? Take a cool bath or shower. Avoid hot water.  Tell all doctors who care for you that you have an allergy.  Keep all follow-up visits  as told by your doctor. This is important. Get help right away if:  You have signs of an allergic reaction. You may notice them soon after being exposed to things that give you an allergic reaction. Signs may include: ? Warmth in your face. Your face may turn red. ? Itchy, red, swollen areas of skin. ? Swelling of your:  Eyes.  Lips.  Face.  Mouth.  Tongue.  Throat. ? Trouble with any of these:  Breathing.  Talking.  Swallowing. ? Loud breathing (wheezing). ? Feeling dizzy or light-headed. ? Passing out. ? Pain or cramps in your  belly. ? Throwing up. ? Watery poop.  You had to use your auto-injector pen. You must go to the emergency room even if the medicine seems to be working. This is because another allergic reaction may happen within 3 days (rebound anaphylaxis). These symptoms may be an emergency. Do not wait to see if the symptoms will go away. Do this right away:  Use your auto-injector pen as you have been told.  Get medical help. Call your local emergency services (911 in the [place] Do not drive yourself to the hospital. Summary  An anaphylactic reaction (anaphylaxis) is a sudden, serious allergic reaction.  This condition can be life-threatening. If you have a reaction, get medical help right away.  Your doctor will show you how to give yourself a shot (epinephrine injection) with an auto-injector "pen."  Always keep an auto-injector pen with you. It could save your life. Use it as told by your doctor.  If you had to use your auto-injector pen, you must go to the emergency room. Go there even if the medicine seems to be working. This information is not intended to replace advice given to you by your health care provider. Make sure you discuss any questions you have with your health care provider. Document Released: 09/24/2007 Document Revised: 07/30/2017 Document Reviewed: 07/30/2017 Elsevier Patient Education  [place] CONSTIPATION   Drink fluids in the recommended amount everyday. Recommend amount is 8 cups of water daily. Do not replace water with Gatorade or Powerade as these should only be used when you are dehydrated.   Eat lots of high fiber foods-fruits, veggies, bran and whole grain instead of white bread  Be active everyday. Inactivity makes constipation worse.  Add psyllium daily (Metamucil) which comes in capsules now. Start very low dose and work up to recommended dose on bottle daily.  Stay away from Merrick or any magnesium containing  laxative, unless you need it to clear things out rarely. It is an addictive laxative and your gut will become dependent on it.  If that is not working, I would start Miralax, which you can buy in generic 17 gms daily. It's a powder and not an "addictive laxative". Take it every day and titrate the dose up or down to get the daily Bm.  We will consider the use of other pharmacological treatments should the above recommendations prove to be unsuccessful.

## 2019-03-04 LAB — LIPID PANEL
Chol/HDL Ratio: 2.6 ratio (ref 0.0–4.4)
Cholesterol, Total: 223 mg/dL — ABNORMAL HIGH (ref 100–199)
HDL: 87 mg/dL (ref >39)
LDL Chol Calc (NIH): 118 mg/dL — ABNORMAL HIGH (ref 0–99)
Triglycerides: 106 mg/dL (ref 0–149)
VLDL Cholesterol Cal: 18 mg/dL (ref 5–40)

## 2019-03-04 LAB — CBC WITH DIFFERENTIAL/PLATELET
Basophils Absolute: 0 10*3/uL (ref 0.0–0.2)
Basos: 1 %
EOS (ABSOLUTE): 0.1 10*3/uL (ref 0.0–0.4)
Eos: 1 %
Hematocrit: 34.4 % (ref 34.0–46.6)
Hemoglobin: 10.7 g/dL — ABNORMAL LOW (ref 11.1–15.9)
Immature Grans (Abs): 0 10*3/uL (ref 0.0–0.1)
Immature Granulocytes: 0 %
Lymphocytes Absolute: 1.4 10*3/uL (ref 0.7–3.1)
Lymphs: 28 %
MCH: 25.2 pg — ABNORMAL LOW (ref 26.6–33.0)
MCHC: 31.1 g/dL — ABNORMAL LOW (ref 31.5–35.7)
MCV: 81 fL (ref 79–97)
Monocytes Absolute: 0.3 10*3/uL (ref 0.1–0.9)
Monocytes: 6 %
Neutrophils Absolute: 3.3 10*3/uL (ref 1.4–7.0)
Neutrophils: 64 %
Platelets: 298 10*3/uL (ref 150–450)
RBC: 4.24 x10E6/uL (ref 3.77–5.28)
RDW: 14.5 % (ref 11.7–15.4)
WBC: 5.2 10*3/uL (ref 3.4–10.8)

## 2019-03-04 LAB — CMP14+EGFR
ALT: 11 IU/L (ref 0–32)
AST: 14 IU/L (ref 0–40)
Albumin/Globulin Ratio: 1.4 (ref 1.2–2.2)
Albumin: 4.2 g/dL (ref 3.8–4.8)
Alkaline Phosphatase: 58 IU/L (ref 39–117)
BUN/Creatinine Ratio: 22 (ref 12–28)
BUN: 16 mg/dL (ref 8–27)
Bilirubin Total: 0.2 mg/dL (ref 0.0–1.2)
CO2: 23 mmol/L (ref 20–29)
Calcium: 9.6 mg/dL (ref 8.7–10.3)
Chloride: 105 mmol/L (ref 96–106)
Creatinine, Ser: 0.72 mg/dL (ref 0.57–1.00)
GFR calc Af Amer: 101 mL/min/{1.73_m2} (ref >59)
GFR calc non Af Amer: 88 mL/min/{1.73_m2} (ref >59)
Globulin, Total: 3 g/dL (ref 1.5–4.5)
Glucose: 88 mg/dL (ref 65–99)
Potassium: 4 mmol/L (ref 3.5–5.2)
Sodium: 141 mmol/L (ref 134–144)
Total Protein: 7.2 g/dL (ref 6.0–8.5)

## 2019-03-04 LAB — SEDIMENTATION RATE: Sed Rate: 79 mm/hr — ABNORMAL HIGH (ref 0–40)

## 2019-03-04 NOTE — Progress Notes (Signed)
Established Patient Office Visit  Subjective:  Patient ID: Betty Mercer, female    DOB: [birth year]  Age: 68 y.o. MRN: [id number]  CC:  Chief Complaint  Patient presents with  . Edema    face    HPI Betty Mercer presents for acute visit she provides picture where she wake up with her tongue swollen and facial swelling there is no known etiology she denies changes detergents, foods or medications. She also has hypertension she denies shortness of breath, headaches, chest pain or lower extremity edema.   History reviewed. No pertinent past medical history.  History reviewed. No pertinent surgical history.  Family History  Problem Relation Age of Onset  . Breast cancer Sister 43    Social History   Socioeconomic History  . Marital status: Widowed    Spouse name: Not on file  . Number of children: Not on file  . Years of education: Not on file  . Highest education level: Not on file  Occupational History  . Not on file  Social Needs  . Financial resource strain: Not on file  . Food insecurity    Worry: Not on file    Inability: Not on file  . Transportation needs    Medical: Not on file    Non-medical: Not on file  Tobacco Use  . Smoking status: Never Smoker  . Smokeless tobacco: Never Used  Substance and Sexual Activity  . Alcohol use: Yes  . Drug use: No  . Sexual activity: Not on file  Lifestyle  . Physical activity    Days per week: Not on file    Minutes per session: Not on file  . Stress: Not on file  Relationships  . Social Herbalist on phone: Not on file    Gets together: Not on file    Attends religious service: Not on file    Active member of club or organization: Not on file    Attends meetings of clubs or organizations: Not on file    Relationship status: Not on file  . Intimate partner violence    Fear of current or ex partner: Not on file    Emotionally abused: Not on file    Physically abused:  Not on file    Forced sexual activity: Not on file  Other Topics Concern  . Not on file  Social History Narrative    Merged History Encounter         Outpatient Medications Prior to Visit  Medication Sig Dispense Refill  . predniSONE (DELTASONE) 20 MG tablet 2 tabs po daily x 4 days 8 tablet 0   No facility-administered medications prior to visit.     No Known Allergies  ROS Review of Systems  Gastrointestinal: Positive for constipation.  All other systems reviewed and are negative.     Objective:    Physical Exam  Constitutional: She is oriented to person, place, and time. She appears well-developed and well-nourished.  Eyes: Pupils are equal, round, and reactive to light. EOM are normal.  Neck: Neck supple. No JVD present. No tracheal deviation present.  Cardiovascular: Normal rate and regular rhythm.  Pulmonary/Chest: Effort normal and breath sounds normal.  Abdominal: Soft. Bowel sounds are normal. She exhibits distension.  Musculoskeletal: Normal range of motion.  Neurological: She is oriented to person, place, and time.  Skin: Skin is warm and dry.  Psychiatric: She has a normal mood and affect. Her behavior is normal. Thought  content normal.    BP 138/79   Pulse 68   Temp (!) 97.3 F (36.3 C) (Temporal)   Ht 5\' 7"  (1.702 m)   Wt 183 lb 3.2 oz (83.1 kg)   SpO2 96%   BMI 28.69 kg/m  Wt Readings from Last 3 Encounters:  03/03/19 183 lb 3.2 oz (83.1 kg)  05/18/18 191 lb (86.6 kg)  03/11/18 188 lb 6.4 oz (85.5 kg)     Health Maintenance Due  Topic Date Due  . COLONOSCOPY  12/24/2002  . DEXA SCAN  12/23/2017  . PNA vac Low Risk Adult (1 of 2 - PCV13) 12/23/2017    There are no preventive care reminders to display for this patient.  No results found for: TSH Lab Results  Component Value Date   WBC 5.2 03/03/2019   HGB 10.7 (L) 03/03/2019   HCT 34.4 03/03/2019   MCV 81 03/03/2019   PLT 298 03/03/2019   Lab Results  Component Value Date   NA  141 03/03/2019   K 4.0 03/03/2019   CO2 23 03/03/2019   GLUCOSE 88 03/03/2019   BUN 16 03/03/2019   CREATININE 0.72 03/03/2019   BILITOT 0.2 03/03/2019   ALKPHOS 58 03/03/2019   AST 14 03/03/2019   ALT 11 03/03/2019   PROT 7.2 03/03/2019   ALBUMIN 4.2 03/03/2019   CALCIUM 9.6 03/03/2019   ANIONGAP 13 01/06/2019   Lab Results  Component Value Date   CHOL 223 (H) 03/03/2019   Lab Results  Component Value Date   HDL 87 03/03/2019   Lab Results  Component Value Date   LDLCALC 118 (H) 03/03/2019   Lab Results  Component Value Date   TRIG 106 03/03/2019   Lab Results  Component Value Date   CHOLHDL 2.6 03/03/2019   Lab Results  Component Value Date   HGBA1C 4.8 03/11/2018      Assessment & Plan:   Leona was seen today for edema.  Diagnoses and all orders for this visit:  Constipation, unspecified constipation type Encourages to eat vegetables and fruit  linaclotide (LINZESS) 72 MCG capsule; Take 1 capsule (72 mcg total) by mouth daily before breakfast. -     Ambulatory referral to Gastroenterology  Elevated blood pressure reading in office without diagnosis of hypertension  Discussed the qualifying diagnosis  blood pressure which is less than 130/80, low-sodium diet, 150 minutes of moderate intensity exercise per week.  -     CBC with Differential -     Complete Metabolic Panel with GFR  Colon cancer screening Patient instructioned on CDC recommends colorectal screening from ages 65-75.      Ambulatory referral to Gastroenterology  Allergic reaction, initial encounter  Use Flonase nasal spray for at least duration of your allergy season.  You may [place] Continue Allegra, Claritin or Zyrtec each day, as needed.  Drink at least 64 ounces of water each day. Unknown causes of allergic reaction for patient safety discussed having benadryl 25mg  on hand and prescribed a epi pen. -     Sedimentation Rate -     Ambulatory referral to Allergy  Lipid screening  To  assist with lowering  work on eating a low fat, heart healthy diet and participate in regular aerobic exercise program to control as well. Exercise at least  30 minutes per day-5 days per week. Avoid red meat. No fried foods. No junk foods, sodas, sugary foods or drinks, unhealthy snacking, alcohol or smoking. -     Lipid Panel  Other orders -     EPINEPHrine 0.3 mg/0.3 mL IJ SOAJ injection; Inject 0.3 mLs (0.3 mg total) into the muscle as needed for anaphylaxis. -     linaclotide (LINZESS) 72 MCG capsule; Take 1 capsule (72 mcg total) by mouth daily before breakfast.    Meds ordered this encounter  Medications  . EPINEPHrine 0.3 mg/0.3 mL IJ SOAJ injection    Sig: Inject 0.3 mLs (0.3 mg total) into the muscle as needed for anaphylaxis.    Dispense:  1 each    Refill:  1  . linaclotide (LINZESS) 72 MCG capsule    Sig: Take 1 capsule (72 mcg total) by mouth daily before breakfast.    Dispense:  30 capsule    Refill:  3    Follow-up: Return if symptoms worsen or fail to improve.    Kerin Perna, NP

## 2019-03-08 ENCOUNTER — Other Ambulatory Visit (INDEPENDENT_AMBULATORY_CARE_PROVIDER_SITE_OTHER): Payer: Self-pay | Admitting: Primary Care

## 2019-03-08 DIAGNOSIS — E782 Mixed hyperlipidemia: Secondary | ICD-10-CM

## 2019-03-08 MED ORDER — PRAVASTATIN SODIUM 40 MG PO TABS: 40.0000 mg | ORAL_TABLET | Freq: Every day | ORAL | 3 refills | Status: DC

## 2019-03-22 DIAGNOSIS — H2513 Age-related nuclear cataract, bilateral: Secondary | ICD-10-CM | POA: Diagnosis not present

## 2019-03-22 DIAGNOSIS — H25013 Cortical age-related cataract, bilateral: Secondary | ICD-10-CM | POA: Diagnosis not present

## 2019-03-22 DIAGNOSIS — H25043 Posterior subcapsular polar age-related cataract, bilateral: Secondary | ICD-10-CM | POA: Diagnosis not present

## 2019-03-22 DIAGNOSIS — H40023 Open angle with borderline findings, high risk, bilateral: Secondary | ICD-10-CM | POA: Diagnosis not present

## 2019-03-22 DIAGNOSIS — H2511 Age-related nuclear cataract, right eye: Secondary | ICD-10-CM | POA: Diagnosis not present

## 2019-03-22 DIAGNOSIS — H18413 Arcus senilis, bilateral: Secondary | ICD-10-CM | POA: Diagnosis not present

## 2019-04-25 DIAGNOSIS — H2511 Age-related nuclear cataract, right eye: Secondary | ICD-10-CM | POA: Diagnosis not present

## 2019-04-26 DIAGNOSIS — H25012 Cortical age-related cataract, left eye: Secondary | ICD-10-CM | POA: Diagnosis not present

## 2019-04-26 DIAGNOSIS — H25042 Posterior subcapsular polar age-related cataract, left eye: Secondary | ICD-10-CM | POA: Diagnosis not present

## 2019-04-26 DIAGNOSIS — H2512 Age-related nuclear cataract, left eye: Secondary | ICD-10-CM | POA: Diagnosis not present

## 2019-05-12 ENCOUNTER — Other Ambulatory Visit: Payer: Self-pay

## 2019-05-12 ENCOUNTER — Emergency Department (HOSPITAL_COMMUNITY)
Admission: EM | Admit: 2019-05-12 | Discharge: 2019-05-12 | Disposition: A | Discharge status: Home / Self Care | Payer: Medicare PPO | Attending: Emergency Medicine | Admitting: Emergency Medicine

## 2019-05-12 DIAGNOSIS — Z79899 Other long term (current) drug therapy: Secondary | ICD-10-CM | POA: Diagnosis not present

## 2019-05-12 DIAGNOSIS — T783XXA Angioneurotic edema, initial encounter: Secondary | ICD-10-CM

## 2019-05-12 DIAGNOSIS — R220 Localized swelling, mass and lump, head: Secondary | ICD-10-CM | POA: Diagnosis present

## 2019-05-12 MED ORDER — METHYLPREDNISOLONE SODIUM SUCC 125 MG IJ SOLR
125.0000 mg | Freq: Once | INTRAMUSCULAR | Status: AC
  Administered 2019-05-12: 125 mg via INTRAVENOUS
  Filled 2019-05-12: qty 2

## 2019-05-12 MED ORDER — FAMOTIDINE IN NACL 20-0.9 MG/50ML-% IV SOLN
20.0000 mg | Freq: Once | INTRAVENOUS | Status: AC
  Administered 2019-05-12: 08:00:00 20 mg via INTRAVENOUS
  Filled 2019-05-12: qty 50

## 2019-05-12 MED ORDER — PREDNISONE 20 MG PO TABS: ORAL_TABLET | ORAL | 0 refills | Status: DC

## 2019-05-12 MED ORDER — DIPHENHYDRAMINE HCL 50 MG/ML IJ SOLN
25.0000 mg | Freq: Once | INTRAMUSCULAR | Status: AC
  Administered 2019-05-12: 25 mg via INTRAVENOUS
  Filled 2019-05-12: qty 1

## 2019-05-12 MED ORDER — SODIUM CHLORIDE 0.9 % IV BOLUS
1000.0000 mL | Freq: Once | INTRAVENOUS | Status: AC
  Administered 2019-05-12: 07:00:00 1000 mL via INTRAVENOUS

## 2019-05-12 NOTE — ED Provider Notes (Signed)
Medical screening examination/treatment/procedure(s) were conducted as a shared visit with non-physician practitioner(s) and myself.  I personally evaluated the patient during the encounter.    Patient reports she has history of similar reaction with tongue swelling.  She woke at 2 AM with symptoms.  She took 25 mg of Benadryl.  She reports the swelling is improved relative to time of onset.  No other rash.  She did start to feel that she is having difficulty breathing.  Patient is alert and nontoxic.  No respiratory distress.  Tongue has diffusely distributed angioedema but posterior airway is clear without sonorous respiration or obstruction at this time.  Body is otherwise free of erythema.  Patient presents with recurrent angioedema.  Etiology apparently unknown.  Agree with proceeding with management per allergic reaction.  We will continue to monitor for improvement\resolution.  Agree with plan of management.   Charlesetta Shanks, MD 05/12/19 [phone]

## 2019-05-12 NOTE — ED Provider Notes (Signed)
[facility] EMERGENCY DEPARTMENT Provider Note   CSN: [id number] Arrival date & time: 05/12/19  [id number]     History Chief Complaint  Patient presents with  . Allergic Reaction    Betty Mercer Betty Mercer is a 69 y.o. female.  The history is provided by the patient and medical records. No language interpreter was used.  Allergic Reaction    69 year old female with history of recurrent angioedema of unknown cause presenting complaining of tongue swelling.  Patient reports she woke up approximately 2:58 AM this morning with sensation of her tongue swelling.  She then took 2 p.o. Benadryl and subsequently drove herself to the ER.  She felt that her tongue is slightly improving after taking the Benadryl.  She did not complain of any lightheadedness, dizziness, trouble swallowing, chest pain, trouble breathing, abdominal cramping or rash.  She mention having similar tongue swelling episodes in the past and states it is usually resolved after receiving treatment in the ER.  She denies any new medication aside from an eyedrop that she has been using daily for the past 2 weeks.  She recalls eating crunchy months that has peanut popcorn last night prior to going to sleep.  She denies any change in her soap, detergent, or body wash.  She is not on an ACE inhibitor.  No past medical history on file.  Patient Active Problem List   Diagnosis Date Noted  . Bilateral chronic knee pain 05/18/2018  . History of anemia 05/18/2018  . Uterine fibroid 05/18/2018  . History of angioedema 05/18/2018    No past surgical history on file.   OB History   No obstetric history on file.     Family History  Problem Relation Age of Onset  . Breast cancer Sister 8    Social History   Tobacco Use  . Smoking status: Never Smoker  . Smokeless tobacco: Never Used  Substance Use Topics  . Alcohol use: Yes  . Drug use: No    Home Medications Prior to Admission medications    Medication Sig Start Date End Date Taking? Authorizing Provider  EPINEPHrine 0.3 mg/0.3 mL IJ SOAJ injection Inject 0.3 mLs (0.3 mg total) into the muscle as needed for anaphylaxis. 03/03/19   Kerin Perna, NP  linaclotide [facility]) 72 MCG capsule Take 1 capsule (72 mcg total) by mouth daily before breakfast. 03/03/19   Kerin Perna, NP  pravastatin (PRAVACHOL) 40 MG tablet Take 1 tablet (40 mg total) by mouth daily. 03/08/19   Kerin Perna, NP    Allergies    Patient has no known allergies.  Review of Systems   Review of Systems  All other systems reviewed and are negative.   Physical Exam Updated Vital Signs BP (!) 152/108 (BP Location: Right Arm)   Pulse 60   Resp 16   Ht 5\' 7"  (1.702 m)   Wt 77.1 kg   SpO2 100%   BMI 26.63 kg/m   Physical Exam Vitals and nursing note reviewed.  Constitutional:      General: She is not in acute distress.    Appearance: She is well-developed.     Comments: Slightly muffled sound when talking however and in no acute distress.  HENT:     Head: Atraumatic.     Mouth/Throat:     Comments: Edema of the tongue but normal appearance of lips. Eyes:     Conjunctiva/sclera: Conjunctivae normal.  Cardiovascular:     Rate and Rhythm: Normal rate  and regular rhythm.     Pulses: Normal pulses.     Heart sounds: Normal heart sounds.  Pulmonary:     Effort: Pulmonary effort is normal.     Breath sounds: Normal breath sounds.  Abdominal:     Palpations: Abdomen is soft.     Tenderness: There is no abdominal tenderness.  Musculoskeletal:     Cervical back: Neck supple.  Skin:    Findings: No rash.  Neurological:     Mental Status: She is alert and oriented to person, place, and time.  Psychiatric:        Mood and Affect: Mood normal.     ED Results / Procedures / Treatments   Labs (all labs ordered are listed, but only abnormal results are displayed) Labs Reviewed - No data to display  EKG None  Radiology No  results found.  Procedures Procedures (including critical care time)  Medications Ordered in ED Medications  methylPREDNISolone sodium succinate (SOLU-MEDROL) 125 mg/2 mL injection 125 mg (has no administration in time range)  famotidine (PEPCID) IVPB 20 mg premix (has no administration in time range)  diphenhydrAMINE (BENADRYL) injection 25 mg (has no administration in time range)  sodium chloride 0.9 % bolus 1,000 mL (has no administration in time range)    ED Course  I have reviewed the triage vital signs and the nursing notes.  Pertinent labs & imaging results that were available during my care of the patient were reviewed by me and considered in my medical decision making (see chart for details).    MDM Rules/Calculators/A&P                     BP (!) 146/84   Pulse (!) 58   Resp 19   Ht 5\' 7"  (1.702 m)   Wt 77.1 kg   SpO2 100%   BMI 26.63 kg/m   Final Clinical Impression(s) / ED Diagnoses Final diagnoses:  Angioedema, initial encounter    Rx / DC Orders ED Discharge Orders         Ordered    predniSONE (DELTASONE) 20 MG tablet     05/12/19 1414         7:19 AM Patient with history of recurrent angioedema of unknown etiology presenting here with tongue swelling that started last night.  She does have evidence of angioedema of the tongue on exam however vital signs stable no high hypoxia no hypotension and no obvious rash.  She will benefit from treatment which includes Solu-Medrol, Benadryl, Pepcid, and IV fluid.  Will monitor closely.  Patient would benefit from outpatient follow-up with an allergist to identify her allergens.  Care discussed with DR. Pfeiffer.   9:09 AM On reassessment, no worsening of sxs.  Appearance of angioedema of the tongue appears similar to initial evaluation.   11:28 AM On reassessment, patient reported no significant improvement of her symptom.  She is concerned that at times in show any improvement as compared to previous visits.   There is still moderate edema noted to the tongue as well as the hypoglossal region but no edema to the posterior oropharynx, no stridor, no sonorous sounds, no drooling.  Patient has been observed for the past 4 hours and have received Solu-Medrol, Benadryl, Pepcid without significant improvement.  I offered admission for close monitoring however patient declined  3:16 PM Pt report improvement of her sxs and request to be discharge.  Will lprescribe prednisone and also recommend outpt f/u with allergist for further  evaluation.  Return precaution given.    Domenic Moras, PA-C 05/12/19 [place], MD 05/13/19 [phone]

## 2019-05-12 NOTE — Discharge Instructions (Signed)
You have been evaluated for your tongue swelling.  This is a recurrent issue and should be evaluated further by an allergist.  Please call and follow up closely with [facility] for further evaluation.  Take prednisone as prescribed.  Return to the ER promptly if your condition worsen or if you have other concerns.

## 2019-05-12 NOTE — ED Notes (Signed)
Patient verbalizes understanding of discharge instructions. Opportunity for questioning and answers were provided. Armband removed by staff, pt discharged from ED. Ambulated out to lobby  

## 2019-05-12 NOTE — ED Triage Notes (Signed)
  Patient comes in with allergic reaction.  Patient states she had some crunch n munch around 2300 last night and woke up around 0200 with tongue swelling.  Patient states she took something for pain and it got better but came in when she started having difficulty breathing.  Patient is A&O x4.  No itching and SOB has improved since she started here.  Patient took 25mg  of benadryl 0200.  SPO2 100% on RA

## 2019-06-02 ENCOUNTER — Other Ambulatory Visit: Payer: Self-pay

## 2019-06-02 ENCOUNTER — Encounter: Payer: Self-pay | Admitting: Allergy

## 2019-06-02 ENCOUNTER — Ambulatory Visit (INDEPENDENT_AMBULATORY_CARE_PROVIDER_SITE_OTHER): Payer: PRIVATE HEALTH INSURANCE | Admitting: Allergy

## 2019-06-02 VITALS — BP 118/64 | HR 59 | Temp 97.8°F | Resp 18 | Ht 65.4 in | Wt 178.4 lb

## 2019-06-02 DIAGNOSIS — T783XXD Angioneurotic edema, subsequent encounter: Secondary | ICD-10-CM

## 2019-06-02 MED ORDER — EPINEPHRINE 0.3 MG/0.3ML IJ SOAJ: 0.3000 mg | Freq: Once | INTRAMUSCULAR | 1 refills | Status: AC

## 2019-06-02 MED ORDER — FAMOTIDINE 20 MG PO TABS: 20.0000 mg | ORAL_TABLET | Freq: Every day | ORAL | 5 refills | Status: AC

## 2019-06-02 NOTE — Patient Instructions (Signed)
Tongue swelling  - many episodes of tongue swelling episodes.    - at this time trigger of these episodes is unknown  - swelling can be histamine driven (like in allergic reactions) or non-histamine driven (like in Hereditary Angioedema)   - we will be getting labwork as follows to help see if we can identify the cause of these swelling episodes: Hereditary angioedema panel, environmental allergy panel, tryptase, alpha gal panel and cherry IgE  - to help see if we can decrease risk of having swelling episodes recommend you take the following antihistamine regimen: Zyrtec 10mg  daily with Pepcid 20mg  daily.   These medications can be taken together twice a day if having swelling on daily dosing  - have access to self-injectable epinephrine Epipen 0.3mg  at all times and follow emergency action plan in case of allergic reaction   - should significant symptoms recur or new symptoms occur, a journal is to be kept recording any foods eaten, beverages consumed, medications taken, activities performed, and environmental conditions within a 6 hour time period prior to the onset of symptoms. For any symptoms concerning for anaphylaxis, epinephrine is to be administered and 911 is to be called immediately.   Follow-up in 3 months or sooner if needed

## 2019-06-02 NOTE — Progress Notes (Signed)
New Patient Note  RE: Betty Mercer MRN: [id number] DOB: 03/13/1951 Date of Office Visit: 06/02/2019  Referring provider: Kerin Perna, NP Primary care provider: Everardo Beals, NP  Chief Complaint: allergic reaction  History of present illness: Betty Mercer is a 69 y.o. female presenting today for consultation for allergic reaction.    She has been having episodes of tongue for the past 5 years.  She states she has about 2-3 episodes of tongue swelling a year.  She does report trouble swallowing with the tongue swelling.  The most recent episode in January she woke up from sleep with tongue swelling.  The first time she states she stopped at [facility] and got an ice cream with cherries on it.  She states later her tongue swelled.   She stays away from cherries.   She is not sure what continues to trigger her swelling episodes.  She does eat red meats in her diet.   No respiratory, GI or CV related symptoms with the tongue swelling episodes.   She has not been on an ACE inhibitor. She has had 1 episode of hives a long time ago.   She reports her mother's face use to swell up for unknown cause.     She had a ED visit on 05/12/2019 for tongue angioedema. On exam she was noted to have a "slightly muffled sound when talking however in no acute distress. Edema of the tongue but normal appearance of lips." She was treated with Solu-Medrol, Pepcid, Benadryl and IV fluids.  She was discharged to continue a course of prednisone.  Her first ED visit occurred on 01/06/2019 for tongue angioedema. She was treated with Benadryl, prednisone and famotidine.    She denies any history of asthma, eczema, food allergy or seasonal/perennial allergies.    Review of systems in past 4 weeks: Review of Systems  Constitutional: Negative.   HENT: Negative.   Eyes: Negative.   Respiratory: Negative.   Cardiovascular: Negative.   Gastrointestinal: Negative.    Musculoskeletal: Negative.   Skin: Negative.   Neurological: Negative.     All other systems negative unless noted above in HPI  Past medical history: Past Medical History:  Diagnosis Date  . Angio-edema     Past surgical history: History reviewed. No pertinent surgical history.  Family history:  Family History  Problem Relation Age of Onset  . Breast cancer Sister 69  . Allergic rhinitis Neg Hx   . Angioedema Neg Hx   . Asthma Neg Hx   . Atopy Neg Hx   . Eczema Neg Hx   . Immunodeficiency Neg Hx   . Urticaria Neg Hx     Social history: Lives in a home without carpeting in bedroom with electric heating and central cooling.  Cats and dogs at home.  No concern for water damage, mildew or roaches in the home.   Denies smoking history.    Medication List: Current Outpatient Medications  Medication Sig Dispense Refill  . EPINEPHrine 0.3 mg/0.3 mL IJ SOAJ injection Inject 0.3 mLs (0.3 mg total) into the muscle as needed for anaphylaxis. 1 each 1  . DUREZOL 0.05 % EMUL Place 1 drop into the right eye 3 (three) times daily.    Marland Kitchen EPINEPHrine (EPIPEN 2-PAK) 0.3 mg/0.3 mL IJ SOAJ injection Inject 0.3 mLs (0.3 mg total) into the muscle once for 1 dose. 2 each 1  . famotidine (PEPCID) 20 MG tablet Take 1 tablet (20 mg total) by  mouth daily. 30 tablet 5  . linaclotide (LINZESS) 72 MCG capsule Take 1 capsule (72 mcg total) by mouth daily before breakfast. (Patient not taking: Reported on 05/12/2019) 30 capsule 3  . pravastatin (PRAVACHOL) 40 MG tablet Take 1 tablet (40 mg total) by mouth daily. (Patient not taking: Reported on 05/12/2019) 90 tablet 3  . predniSONE (DELTASONE) 20 MG tablet 3 tabs po day one, then 2 tabs daily x 4 days (Patient not taking: Reported on 06/02/2019) 11 tablet 0   No current facility-administered medications for this visit.    Known medication allergies: No Known Allergies   Physical examination: Blood pressure 118/64, pulse (!) 59, temperature 97.8 F  (36.6 C), temperature source Temporal, resp. rate 18, height 5' 5.4" (1.661 m), weight 178 lb 6.4 oz (80.9 kg), SpO2 99 %.  General: Alert, interactive, in no acute distress. HEENT: PERRLA, TMs pearly gray, turbinates non-edematous without discharge, post-pharynx non erythematous. Neck: Supple without lymphadenopathy. Lungs: Clear to auscultation without wheezing, rhonchi or rales. {no increased work of breathing. CV: Normal S1, S2 without murmurs. Abdomen: Nondistended, nontender. Skin: Warm and dry, without lesions or rashes. Extremities:  No clubbing, cyanosis or edema. Neuro:   Grossly intact.  Diagnositics/Labs: None today  Assessment and plan: Angioedema, tongue  - many episodes of tongue swelling episodes.    - at this time trigger of these episodes is unknown  - swelling can be histamine driven (like in allergic reactions) or non-histamine driven (like in Hereditary Angioedema)   - we will be getting labwork as follows to help see if we can identify the cause of these swelling episodes: Hereditary angioedema panel, environmental allergy panel, tryptase, alpha gal panel and cherry IgE  - to help see if we can decrease risk of having swelling episodes recommend you take the following antihistamine regimen: Zyrtec 10mg  daily with Pepcid 20mg  daily.   These medications can be taken together twice a day if having swelling on daily dosing  - have access to self-injectable epinephrine Epipen 0.3mg  at all times and follow emergency action plan in case of allergic reaction   - should significant symptoms recur or new symptoms occur, a journal is to be kept recording any foods eaten, beverages consumed, medications taken, activities performed, and environmental conditions within a 6 hour time period prior to the onset of symptoms. For any symptoms concerning for anaphylaxis, epinephrine is to be administered and 911 is to be called immediately.   Follow-up in 3 months or sooner if needed  I  appreciate the opportunity to take part in Betty Mercer's care. Please do not hesitate to contact me with questions.  Sincerely,   Prudy Feeler, MD Allergy/Immunology [facility] [place] [facility] [place]

## 2019-06-11 LAB — ALLERGENS W/TOTAL IGE AREA 2

## 2019-06-11 LAB — ALPHA-GAL PANEL
Alpha Gal IgE*: <0.1 kU/L (ref <0.10)
Beef (Bos spp) IgE: <0.1 kU/L (ref <0.35)
Class Interpretation: 0
Class Interpretation: 0
Class Interpretation: 0
Lamb/Mutton (Ovis spp) IgE: <0.1 kU/L (ref <0.35)
Pork (Sus spp) IgE: <0.1 kU/L (ref <0.35)

## 2019-06-11 LAB — C1 ESTERASE INHIBITOR, FUNCTIONAL: C1INH Functional/C1INH Total MFr SerPl: 95 %mean normal

## 2019-06-11 LAB — TRYPTASE: Tryptase: 6.3 ug/L (ref 2.2–13.2)

## 2019-06-11 LAB — C1 ESTERASE INHIBITOR: C1INH SerPl-mCnc: 35 mg/dL (ref 21–39)

## 2019-06-11 LAB — ALLERGEN, CHERRY, F242: F242-IgE Bing Cherry: <0.1 kU/L

## 2019-06-11 LAB — COMPLEMENT COMPONENT C1Q: Complement C1Q: 17 mg/dL (ref 10.3–20.5)

## 2019-06-11 LAB — C4 COMPLEMENT: Complement C4, Serum: 28 mg/dL (ref 12–38)

## 2019-08-31 ENCOUNTER — Ambulatory Visit: Payer: PRIVATE HEALTH INSURANCE | Admitting: Allergy

## 2020-03-06 ENCOUNTER — Encounter (HOSPITAL_COMMUNITY): Payer: Self-pay

## 2020-03-06 ENCOUNTER — Other Ambulatory Visit: Payer: Self-pay

## 2020-03-06 ENCOUNTER — Emergency Department (HOSPITAL_COMMUNITY)
Admission: EM | Admit: 2020-03-06 | Discharge: 2020-03-06 | Disposition: A | Discharge status: Home / Self Care | Payer: Medicare (Managed Care) | Attending: Emergency Medicine | Admitting: Emergency Medicine

## 2020-03-06 DIAGNOSIS — R202 Paresthesia of skin: Secondary | ICD-10-CM | POA: Insufficient documentation

## 2020-03-06 DIAGNOSIS — M545 Low back pain, unspecified: Secondary | ICD-10-CM | POA: Diagnosis not present

## 2020-03-06 DIAGNOSIS — N3000 Acute cystitis without hematuria: Secondary | ICD-10-CM | POA: Insufficient documentation

## 2020-03-06 DIAGNOSIS — R109 Unspecified abdominal pain: Secondary | ICD-10-CM | POA: Diagnosis not present

## 2020-03-06 DIAGNOSIS — R300 Dysuria: Secondary | ICD-10-CM | POA: Diagnosis present

## 2020-03-06 LAB — URINALYSIS, ROUTINE W REFLEX MICROSCOPIC
Bilirubin Urine: NEGATIVE
Glucose, UA: NEGATIVE mg/dL
Ketones, ur: NEGATIVE mg/dL
Nitrite: POSITIVE — AB
Protein, ur: NEGATIVE mg/dL
Specific Gravity, Urine: 1.015 (ref 1.005–1.030)
pH: 6 (ref 5.0–8.0)

## 2020-03-06 MED ORDER — CEPHALEXIN 250 MG PO CAPS
1000.0000 mg | ORAL_CAPSULE | Freq: Once | ORAL | Status: AC
  Administered 2020-03-06: 1000 mg via ORAL
  Filled 2020-03-06: qty 4

## 2020-03-06 MED ORDER — CEPHALEXIN 500 MG PO CAPS: 500.0000 mg | ORAL_CAPSULE | Freq: Four times a day (QID) | ORAL | 0 refills | Status: DC

## 2020-03-06 NOTE — ED Triage Notes (Signed)
Pt reports lower back pain and dysuria for a few months now. Denies nausea or vomiting. Pt also reports bilateral feet numbness and numbness in her last two fingers on her left hand for 2 months. Pt denies any medical problems. Pt a.o, nad noted.

## 2020-03-06 NOTE — ED Provider Notes (Signed)
[facility] EMERGENCY DEPARTMENT Provider Note   CSN: [id number] Arrival date & time: 03/06/20  4132     History Chief Complaint  Patient presents with  . Back Pain  . Dysuria  . Numbness    Betty Mercer is a 69 y.o. female.  69 yo F with a chief complaints of pressure when she feels like she needs to urinate.  Has been urinating more frequently and then feels like she needs to urinate again shortly thereafter.  Has been urinating about 4 times every night.  She has been having some left-sided flank pain with this.  Symptoms been off and on for about 3 months now.  She kept expecting to go away but has not.  No fevers.  Has a history of urinary tract infections in the past feels this is somewhat similar.  Patient is also complaining of tingling to her fingers and toes.  This is off and on and is bilateral.  Denies any trauma.  Is been going on for 3 to 6 months as well.  The history is provided by the patient.  Back Pain Location:  Lumbar spine Quality:  Aching Radiates to:  Does not radiate Pain severity:  Moderate Onset quality:  Gradual Duration:  3 weeks Progression:  Waxing and waning Chronicity:  New Relieved by:  Nothing Worsened by:  Nothing Ineffective treatments:  None tried Associated symptoms: dysuria   Associated symptoms: no chest pain, no fever and no headaches   Dysuria Associated symptoms: flank pain   Associated symptoms: no fever, no nausea and no vomiting        Past Medical History:  Diagnosis Date  . Angio-edema     Patient Active Problem List   Diagnosis Date Noted  . Bilateral chronic knee pain 05/18/2018  . History of anemia 05/18/2018  . Uterine fibroid 05/18/2018  . History of angioedema 05/18/2018    History reviewed. No pertinent surgical history.   OB History   No obstetric history on file.     Family History  Problem Relation Age of Onset  . Breast cancer Sister 55  . Allergic  rhinitis Neg Hx   . Angioedema Neg Hx   . Asthma Neg Hx   . Atopy Neg Hx   . Eczema Neg Hx   . Immunodeficiency Neg Hx   . Urticaria Neg Hx     Social History   Tobacco Use  . Smoking status: Never Smoker  . Smokeless tobacco: Never Used  Substance Use Topics  . Alcohol use: Yes  . Drug use: No    Home Medications Prior to Admission medications   Medication Sig Start Date End Date Taking? Authorizing Provider  cephALEXin (KEFLEX) 500 MG capsule Take 1 capsule (500 mg total) by mouth 4 (four) times daily. 03/06/20   Deno Etienne, DO  DUREZOL 0.05 % EMUL Place 1 drop into the right eye 3 (three) times daily. 03/29/19   [provider]  EPINEPHrine (EPIPEN 2-PAK) 0.3 mg/0.3 mL IJ SOAJ injection Inject 0.3 mLs (0.3 mg total) into the muscle once for 1 dose. 06/02/19 06/02/19  Kennith Gain, MD  EPINEPHrine 0.3 mg/0.3 mL IJ SOAJ injection Inject 0.3 mLs (0.3 mg total) into the muscle as needed for anaphylaxis. 03/03/19   Kerin Perna, NP  famotidine (PEPCID) 20 MG tablet Take 1 tablet (20 mg total) by mouth daily. 06/02/19   Kennith Gain, MD  linaclotide [facility]) 72 MCG capsule Take 1 capsule (72  mcg total) by mouth daily before breakfast. Patient not taking: Reported on 05/12/2019 03/03/19   Kerin Perna, NP  pravastatin (PRAVACHOL) 40 MG tablet Take 1 tablet (40 mg total) by mouth daily. Patient not taking: Reported on 05/12/2019 03/08/19   Kerin Perna, NP  predniSONE (DELTASONE) 20 MG tablet 3 tabs po day one, then 2 tabs daily x 4 days Patient not taking: Reported on 06/02/2019 05/12/19   Domenic Moras, PA-C    Allergies    Patient has no known allergies.  Review of Systems   Review of Systems  Constitutional: Negative for chills and fever.  HENT: Negative for congestion and rhinorrhea.   Eyes: Negative for redness and visual disturbance.  Respiratory: Negative for shortness of breath and wheezing.   Cardiovascular: Negative for  chest pain and palpitations.  Gastrointestinal: Negative for nausea and vomiting.  Genitourinary: Positive for dysuria, flank pain, frequency and urgency.  Musculoskeletal: Positive for back pain. Negative for arthralgias and myalgias.  Skin: Negative for pallor and wound.  Neurological: Negative for dizziness and headaches.    Physical Exam Updated Vital Signs BP (!) 164/74 (BP Location: Right Arm)   Pulse (!) 54   Temp 98.4 F (36.9 C) (Oral)   Resp 17   Ht 5\' 7"  (1.702 m)   Wt 78 kg   SpO2 100%   BMI 26.94 kg/m   Physical Exam Vitals and nursing note reviewed.  Constitutional:      General: She is not in acute distress.    Appearance: She is well-developed. She is not diaphoretic.  HENT:     Head: Normocephalic and atraumatic.  Eyes:     Pupils: Pupils are equal, round, and reactive to light.  Cardiovascular:     Rate and Rhythm: Normal rate and regular rhythm.     Heart sounds: No murmur heard.  No friction rub. No gallop.   Pulmonary:     Effort: Pulmonary effort is normal.     Breath sounds: No wheezing or rales.  Abdominal:     General: There is no distension.     Palpations: Abdomen is soft.     Tenderness: There is no abdominal tenderness. There is no right CVA tenderness, left CVA tenderness or guarding.  Musculoskeletal:        General: No tenderness.     Cervical back: Normal range of motion and neck supple.     Comments: Pulse motor and sensation intact to all 4 extremities.  Skin:    General: Skin is warm and dry.  Neurological:     Mental Status: She is alert and oriented to person, place, and time.  Psychiatric:        Behavior: Behavior normal.     ED Results / Procedures / Treatments   Labs (all labs ordered are listed, but only abnormal results are displayed) Labs Reviewed  URINALYSIS, ROUTINE W REFLEX MICROSCOPIC - Abnormal; Notable for the following components:      Result Value   APPearance CLOUDY (*)    Hgb urine dipstick SMALL (*)     Nitrite POSITIVE (*)    Leukocytes,Ua MODERATE (*)    Bacteria, UA RARE (*)    All other components within normal limits    EKG None  Radiology No results found.  Procedures Procedures (including critical care time)  Medications Ordered in ED Medications  cephALEXin (KEFLEX) capsule 1,000 mg (has no administration in time range)    ED Course  I have reviewed the triage  vital signs and the nursing notes.  Pertinent labs & imaging results that were available during my care of the patient were reviewed by me and considered in my medical decision making (see chart for details).    MDM Rules/Calculators/A&P                          69 yo F with a chief complaints of a feeling like she needs to urinate and a pressure when she urinates and urinating more often than normal.  Patient's urine looks infected with white cells and nitrate positive.  We will do a trial of antibiotics.  Sent for culture.  She is also complaining of some tingling to her fingers and toes.  No neurologic deficit on exam.  Could be due to chronic condition though no glucose in her urine mildly hypertensive here.  We will have her follow-up with her doctor in the office.  9:44 AM:  I have discussed the diagnosis/risks/treatment options with the patient and believe the pt to be eligible for discharge home to follow-up with PCP, GYN. We also discussed returning to the ED immediately if new or worsening sx occur. We discussed the sx which are most concerning (e.g., sudden worsening pain, fever, inability to tolerate by mouth) that necessitate immediate return. Medications administered to the patient during their visit and any new prescriptions provided to the patient are listed below.  Medications given during this visit Medications  cephALEXin (KEFLEX) capsule 1,000 mg (has no administration in time range)     The patient appears reasonably screen and/or stabilized for discharge and I doubt any other medical  condition or other [facility] requiring further screening, evaluation, or treatment in the ED at this time prior to discharge.   Final Clinical Impression(s) / ED Diagnoses Final diagnoses:  Acute cystitis without hematuria    Rx / DC Orders ED Discharge Orders         Ordered    cephALEXin (KEFLEX) 500 MG capsule  4 times daily        03/06/20 [place], [place], DO 03/06/20 [phone]

## 2020-03-06 NOTE — Discharge Instructions (Addendum)
Return for fever, inability to eat or drink.   Follow-up with your family doctor for the tingling to your fingers and toes.  Asked them about seeing a GI doctor for colonoscopy.  I have attached information for the women's clinic in case you would like to be evaluated for your persistent urinary symptoms if this is due to an anatomy change.

## 2020-05-14 ENCOUNTER — Ambulatory Visit: Payer: Medicare (Managed Care) | Admitting: Nurse Practitioner

## 2020-05-21 ENCOUNTER — Encounter: Payer: Self-pay | Admitting: Nurse Practitioner

## 2020-05-21 ENCOUNTER — Telehealth: Payer: Self-pay | Admitting: Nurse Practitioner

## 2020-05-21 NOTE — Telephone Encounter (Signed)
Pt was no show for appt 05/14/2020 new patient. 1st occurrence. Fee waived. Letter mailed.

## 2020-07-11 ENCOUNTER — Other Ambulatory Visit: Payer: Self-pay

## 2020-07-11 ENCOUNTER — Emergency Department (HOSPITAL_COMMUNITY): Payer: Medicare (Managed Care)

## 2020-07-11 ENCOUNTER — Emergency Department (HOSPITAL_COMMUNITY)
Admission: EM | Admit: 2020-07-11 | Discharge: 2020-07-11 | Disposition: A | Discharge status: Home / Self Care | Payer: Medicare (Managed Care) | Attending: Emergency Medicine | Admitting: Emergency Medicine

## 2020-07-11 ENCOUNTER — Encounter (HOSPITAL_COMMUNITY): Payer: Self-pay

## 2020-07-11 DIAGNOSIS — W010XXA Fall on same level from slipping, tripping and stumbling without subsequent striking against object, initial encounter: Secondary | ICD-10-CM | POA: Insufficient documentation

## 2020-07-11 DIAGNOSIS — M542 Cervicalgia: Secondary | ICD-10-CM | POA: Insufficient documentation

## 2020-07-11 DIAGNOSIS — Y9301 Activity, walking, marching and hiking: Secondary | ICD-10-CM | POA: Insufficient documentation

## 2020-07-11 DIAGNOSIS — M25562 Pain in left knee: Secondary | ICD-10-CM | POA: Diagnosis not present

## 2020-07-11 DIAGNOSIS — Y9259 Other trade areas as the place of occurrence of the external cause: Secondary | ICD-10-CM | POA: Diagnosis not present

## 2020-07-11 MED ORDER — CYCLOBENZAPRINE HCL 5 MG PO TABS: 5.0000 mg | ORAL_TABLET | Freq: Three times a day (TID) | ORAL | 0 refills | Status: AC | PRN

## 2020-07-11 MED ORDER — IBUPROFEN 600 MG PO TABS: 600.0000 mg | ORAL_TABLET | Freq: Four times a day (QID) | ORAL | 0 refills | Status: AC | PRN

## 2020-07-11 NOTE — ED Notes (Signed)
Patient transported to X-ray 

## 2020-07-11 NOTE — ED Provider Notes (Signed)
[facility] Provider Note   CSN: [id number] Arrival date & time: 07/11/20  1716     History No chief complaint on file.   Betty Mercer Betty Mercer is a 70 y.o. female.  HPI   70 year old female presents the emergency [facility] with neck and left knee pain.  Patient states a couple weeks ago she had a mechanical fall while at the mall.  She was walking, tripped and fell down onto her left knee, she then rolled onto her right buttocks and right shoulder.  She states since then she has been having left knee and neck pain.  She also feels as if her muscles are sore.  She did not hit her head, there is no loss of consciousness, no syncope.  Past Medical History:  Diagnosis Date  . Angio-edema     Patient Active Problem List   Diagnosis Date Noted  . Bilateral chronic knee pain 05/18/2018  . History of anemia 05/18/2018  . Uterine fibroid 05/18/2018  . History of angioedema 05/18/2018    History reviewed. No pertinent surgical history.   OB History   No obstetric history on file.     Family History  Problem Relation Age of Onset  . Breast cancer Sister 63  . Allergic rhinitis Neg Hx   . Angioedema Neg Hx   . Asthma Neg Hx   . Atopy Neg Hx   . Eczema Neg Hx   . Immunodeficiency Neg Hx   . Urticaria Neg Hx     Social History   Tobacco Use  . Smoking status: Never Smoker  . Smokeless tobacco: Never Used  Substance Use Topics  . Alcohol use: Yes  . Drug use: No    Home Medications Prior to Admission medications   Medication Sig Start Date End Date Taking? Authorizing Provider  cephALEXin (KEFLEX) 500 MG capsule Take 1 capsule (500 mg total) by mouth 4 (four) times daily. 03/06/20   Deno Etienne, DO  DUREZOL 0.05 % EMUL Place 1 drop into the right eye 3 (three) times daily. 03/29/19   [provider]  EPINEPHrine (EPIPEN 2-PAK) 0.3 mg/0.3 mL IJ SOAJ injection Inject 0.3 mLs (0.3 mg total) into the muscle once  for 1 dose. 06/02/19 06/02/19  Kennith Gain, MD  EPINEPHrine 0.3 mg/0.3 mL IJ SOAJ injection Inject 0.3 mLs (0.3 mg total) into the muscle as needed for anaphylaxis. 03/03/19   Kerin Perna, NP  famotidine (PEPCID) 20 MG tablet Take 1 tablet (20 mg total) by mouth daily. 06/02/19   Kennith Gain, MD  linaclotide Rolan Lipa) 72 MCG capsule Take 1 capsule (72 mcg total) by mouth daily before breakfast. Patient not taking: Reported on 05/12/2019 03/03/19   Kerin Perna, NP  pravastatin (PRAVACHOL) 40 MG tablet Take 1 tablet (40 mg total) by mouth daily. Patient not taking: Reported on 05/12/2019 03/08/19   Kerin Perna, NP  predniSONE (DELTASONE) 20 MG tablet 3 tabs po day one, then 2 tabs daily x 4 days Patient not taking: Reported on 06/02/2019 05/12/19   Domenic Moras, PA-C    Allergies    Patient has no known allergies.  Review of Systems   Review of Systems  Constitutional: Negative for chills and fever.  HENT: Negative for congestion.   Eyes: Negative for visual disturbance.  Respiratory: Negative for shortness of breath.   Cardiovascular: Negative for chest pain.  Gastrointestinal: Negative for abdominal pain, diarrhea and vomiting.  Genitourinary: Negative for dysuria.  Musculoskeletal: Positive for neck pain.       + Left knee pain  Skin: Negative for rash.  Neurological: Negative for seizures, syncope and headaches.    Physical Exam Updated Vital Signs BP 137/87 (BP Location: Left Arm)   Pulse 65   Temp 98.7 F (37.1 C)   Resp 17   SpO2 99%   Physical Exam Vitals and nursing note reviewed.  Constitutional:      Appearance: Normal appearance.  HENT:     Head: Normocephalic.     Mouth/Throat:     Mouth: Mucous membranes are moist.  Neck:     Comments: Tenderness to palpation of the paraspinal cervical musculature with no actual midline spinal tenderness Cardiovascular:     Rate and Rhythm: Normal rate.  Pulmonary:     Effort:  Pulmonary effort is normal. No respiratory distress.  Abdominal:     Palpations: Abdomen is soft.     Tenderness: There is no abdominal tenderness.  Musculoskeletal:     Cervical back: Normal range of motion. No rigidity.     Comments: Tenderness to palpation of the left knee without any obvious deformity or swelling  Skin:    General: Skin is warm.  Neurological:     Mental Status: She is alert and oriented to person, place, and time. Mental status is at baseline.  Psychiatric:        Mood and Affect: Mood normal.     ED Results / Procedures / Treatments   Labs (all labs ordered are listed, but only abnormal results are displayed) Labs Reviewed - No data to display  EKG None  Radiology No results found.  Procedures Procedures   Medications Ordered in ED Medications - No data to display  ED Course  I have reviewed the triage vital signs and the nursing notes.  Pertinent labs & imaging results that were available during my care of the patient were reviewed by me and considered in my medical decision making (see chart for details).    MDM Rules/Calculators/A&P                          70 year old female presents the emergency [facility] after a mechanical fall a couple weeks ago.  Complaining of neck and left knee pain.  She has paraspinal pain, no obvious deformities, Nexus criteria negative.  X-ray shows no neck fracture, shows an old fusion.  Knee x-ray shows no fractures, she is been ambulatory, no neuro symptoms, very low suspicion for acute injury given the lapse since the injury.  Most likely muscle strain/injury. Will treat symptomatically and refer as an outpatient.  Patient will be discharged and treated as an outpatient.  Discharge plan and strict return to ED precautions discussed, patient verbalizes understanding and agreement.  Final Clinical Impression(s) / ED Diagnoses Final diagnoses:  None    Rx / DC Orders ED Discharge Orders    None        Lorelle Gibbs, DO 07/11/20 2327

## 2020-07-11 NOTE — Discharge Instructions (Signed)
You have been seen and discharged from the emergency department.  Your x-rays showed no fracture.  You have been prescribed a muscle relaxer and ibuprofen.  Do not mix this muscle relaxer medication with alcohol or other sedating medications. Do not drive or do heavy physical activity and to know how this medication affects you.  It may cause drowsiness.  Follow-up with your primary provider for reevaluation and further care.  If your pain continues follow-up with orthopedics for further imaging and evaluation.  Take home medications as prescribed. If you have any worsening symptoms or further concerns for health please return to an emergency department for further evaluation.

## 2020-07-11 NOTE — ED Triage Notes (Signed)
Patient complains of neck pain after falling at the mall 1st of the month. States her legs buckled. Patient alert and oriented, NAD

## 2021-05-13 ENCOUNTER — Other Ambulatory Visit (HOSPITAL_COMMUNITY): Payer: Self-pay | Admitting: Student

## 2021-05-13 DIAGNOSIS — E663 Overweight: Secondary | ICD-10-CM

## 2021-05-13 DIAGNOSIS — E785 Hyperlipidemia, unspecified: Secondary | ICD-10-CM

## 2021-05-21 ENCOUNTER — Other Ambulatory Visit: Payer: Self-pay

## 2021-05-21 ENCOUNTER — Ambulatory Visit (HOSPITAL_COMMUNITY)
Admission: RE | Admit: 2021-05-21 | Discharge: 2021-05-21 | Disposition: A | Discharge status: Home / Self Care | Payer: Medicare HMO | Source: Ambulatory Visit | Attending: Student | Admitting: Student

## 2021-05-21 DIAGNOSIS — I340 Nonrheumatic mitral (valve) insufficiency: Secondary | ICD-10-CM | POA: Insufficient documentation

## 2021-05-21 DIAGNOSIS — E663 Overweight: Secondary | ICD-10-CM | POA: Diagnosis present

## 2021-05-21 DIAGNOSIS — E785 Hyperlipidemia, unspecified: Secondary | ICD-10-CM | POA: Insufficient documentation

## 2021-05-21 LAB — ECHOCARDIOGRAM COMPLETE
Area-P 1/2: 3.12 cm2
MV M vel: 5.77 m/s
MV Peak grad: 133.2 mmHg
Radius: 0.5 cm
S' Lateral: 3.1 cm

## 2022-01-30 ENCOUNTER — Emergency Department (HOSPITAL_COMMUNITY)
Admission: EM | Admit: 2022-01-30 | Discharge: 2022-01-31 | Disposition: A | Discharge status: Home / Self Care | Payer: Medicare HMO | Attending: Emergency Medicine | Admitting: Emergency Medicine

## 2022-01-30 DIAGNOSIS — L0291 Cutaneous abscess, unspecified: Secondary | ICD-10-CM

## 2022-01-30 DIAGNOSIS — L0211 Cutaneous abscess of neck: Secondary | ICD-10-CM | POA: Diagnosis present

## 2022-01-30 NOTE — ED Triage Notes (Signed)
Pt complains of bump on right side neck. First noticed in sept 2023. Seems to have grown suddenly in past week. Has been taking antibiotic from PCP for 3 days now- doxycycline. Has not noticed any improvement of size. Pain minimal. Pt would like second opinion and possible drainage.

## 2022-01-31 ENCOUNTER — Other Ambulatory Visit: Payer: Self-pay

## 2022-01-31 ENCOUNTER — Encounter (HOSPITAL_COMMUNITY): Payer: Self-pay

## 2022-01-31 MED ORDER — LIDOCAINE-EPINEPHRINE (PF) 2 %-1:200000 IJ SOLN
20.0000 mL | Freq: Once | INTRAMUSCULAR | Status: AC
  Administered 2022-01-31: 20 mL
  Filled 2022-01-31: qty 20

## 2022-01-31 NOTE — ED Provider Notes (Signed)
[facility] EMERGENCY DEPARTMENT Provider Note   CSN: [id number] Arrival date & time: 01/30/22  1515     History  Chief Complaint  Patient presents with   Abscess    Betty Mercer is a 71 y.o. female.  Has had a painful area with a bump on her right lateral neck for the last week or so. Saw her PCP a few days ago and started doxy but it got much worse and is draining some pus for the last day. No fevers. No systemic symptoms.    Abscess      Home Medications Prior to Admission medications   Medication Sig Start Date End Date Taking? Authorizing Provider  cephALEXin (KEFLEX) 500 MG capsule Take 1 capsule (500 mg total) by mouth 4 (four) times daily. 03/06/20   Deno Etienne, DO  cyclobenzaprine (FLEXERIL) 5 MG tablet Take 1 tablet (5 mg total) by mouth 3 (three) times daily as needed for muscle spasms. 07/11/20   Horton, Kristie M, DO  DUREZOL 0.05 % EMUL Place 1 drop into the right eye 3 (three) times daily. 03/29/19   [provider]  EPINEPHrine (EPIPEN 2-PAK) 0.3 mg/0.3 mL IJ SOAJ injection Inject 0.3 mLs (0.3 mg total) into the muscle once for 1 dose. 06/02/19 06/02/19  Kennith Gain, MD  EPINEPHrine 0.3 mg/0.3 mL IJ SOAJ injection Inject 0.3 mLs (0.3 mg total) into the muscle as needed for anaphylaxis. 03/03/19   Kerin Perna, NP  famotidine (PEPCID) 20 MG tablet Take 1 tablet (20 mg total) by mouth daily. 06/02/19   Kennith Gain, MD  ibuprofen (ADVIL) 600 MG tablet Take 1 tablet (600 mg total) by mouth every 6 (six) hours as needed. 07/11/20   Horton, Alvin Critchley, DO      Allergies    Patient has no known allergies.    Review of Systems   Review of Systems  Physical Exam Updated Vital Signs BP (!) 156/98 (BP Location: Right Arm)   Pulse 60   Temp 98.5 F (36.9 C)   Resp 16   SpO2 98%  Physical Exam Vitals and nursing note reviewed.  Constitutional:      Appearance: She is well-developed.   HENT:     Head: Normocephalic and atraumatic.     Mouth/Throat:     Mouth: Mucous membranes are moist.     Pharynx: Oropharynx is clear.  Eyes:     Pupils: Pupils are equal, round, and reactive to light.  Neck:     Comments: 2x2 cm abscess to right lateral neck with surrounding cellulitis Cardiovascular:     Rate and Rhythm: Normal rate and regular rhythm.  Pulmonary:     Effort: No respiratory distress.     Breath sounds: No stridor.  Abdominal:     General: Abdomen is flat. There is no distension.  Musculoskeletal:     Cervical back: Normal range of motion.  Neurological:     Mental Status: She is alert.     ED Results / Procedures / Treatments   Labs (all labs ordered are listed, but only abnormal results are displayed) Labs Reviewed - No data to display  EKG None  Radiology No results found.  Procedures .Marland KitchenIncision and Drainage  Date/Time: 01/31/2022 5:50 AM  Performed by: Merrily Pew, MD Authorized by: Merrily Pew, MD   Consent:    Consent obtained:  Verbal   Risks discussed:  Bleeding, damage to other organs, incomplete drainage, pain and infection Universal protocol:  Procedure explained and questions answered to patient or proxy's satisfaction: yes     Patient identity confirmed:  Verbally with patient Location:    Type:  Abscess   Size:  2x2   Location:  Neck   Neck location:  L posterior Sedation:    Sedation type:  None Anesthesia:    Anesthesia method:  Local infiltration   Local anesthetic:  Lidocaine 2% WITH epi Procedure type:    Complexity:  Simple Procedure details:    Incision types:  Single straight   Wound management:  Irrigated with saline   Drainage:  Purulent   Drainage amount:  Moderate   Wound treatment:  Wound left open   Packing materials:  None Post-procedure details:    Procedure completion:  Tolerated     Medications Ordered in ED Medications  lidocaine-EPINEPHrine (XYLOCAINE W/EPI) 2 %-1:200000 (PF)  injection 20 mL (20 mLs Infiltration Given by Other 01/31/22 0216)    ED Course/ Medical Decision Making/ A&P                           Medical Decision Making Risk Prescription drug management.   Abscess I&D completed with moderate purulent drainage. Already on appropriate antibiotics. Will fu w/ PCP in 3 days for wound recheck, return here for worsening symptoms.   Final Clinical Impression(s) / ED Diagnoses Final diagnoses:  Abscess    Rx / DC Orders ED Discharge Orders     None         Gia Lusher, Corene Cornea, MD 01/31/22 [phone]

## 2022-01-31 NOTE — ED Notes (Signed)
Provider at bedside to preform procedure on abscess

## 2022-04-17 IMAGING — CR DG CERVICAL SPINE COMPLETE 4+V
5 series · 5 of 5 positions shown · non-contrast
Comparison: None.

CLINICAL DATA: Posterior neck pain, fell 3 weeks ago

EXAM:
CERVICAL SPINE - COMPLETE 4+ VIEW

[c-spine lat]
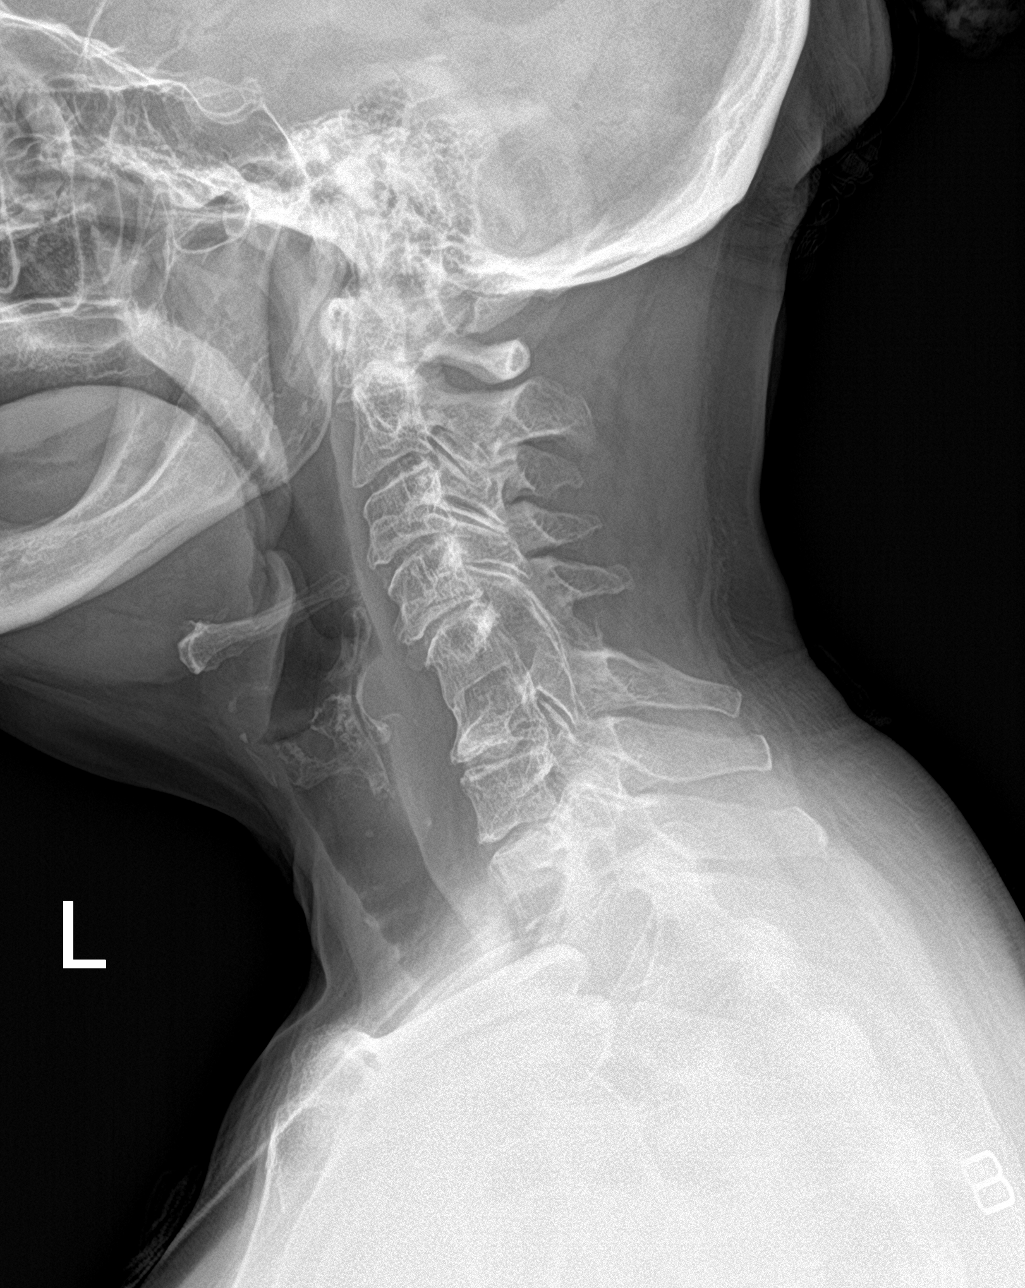

[c-spine obl (1 of 2)]
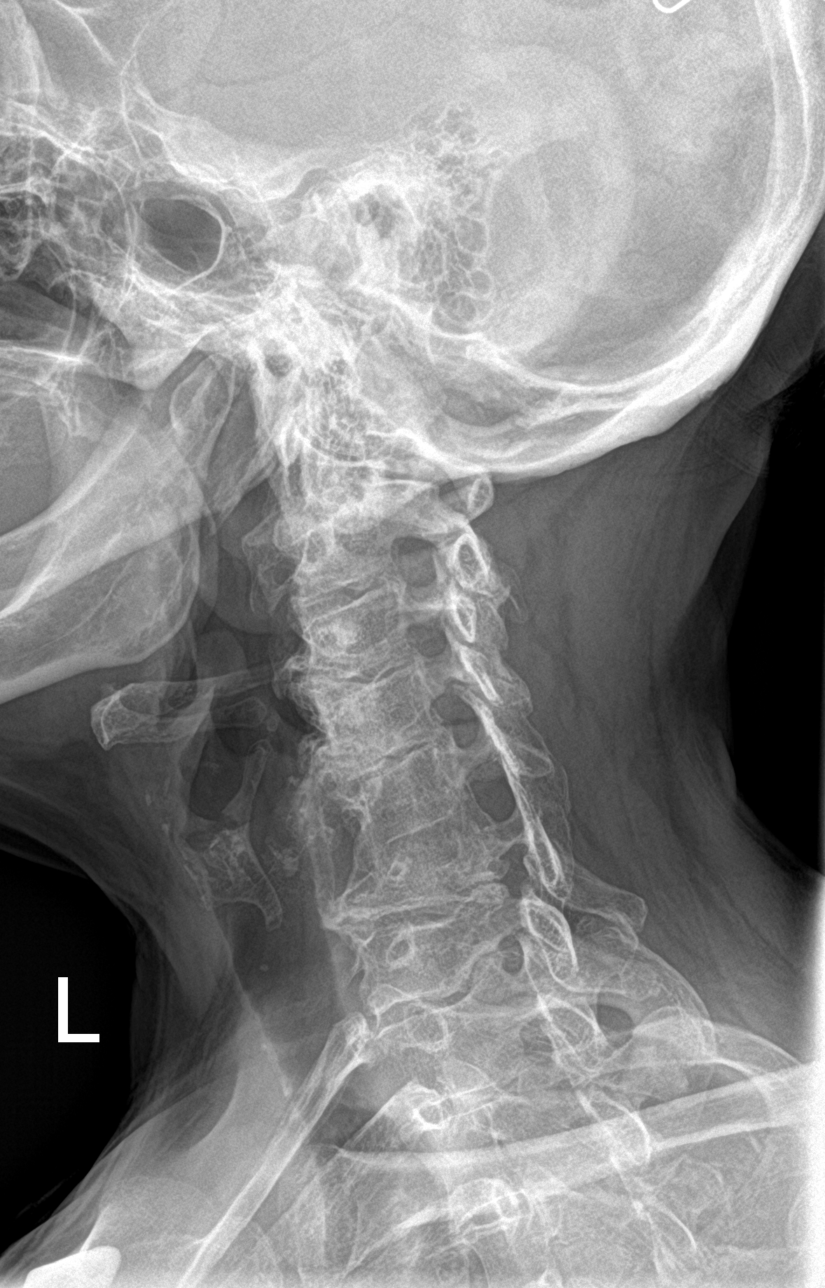

[c-spine obl (2 of 2)]
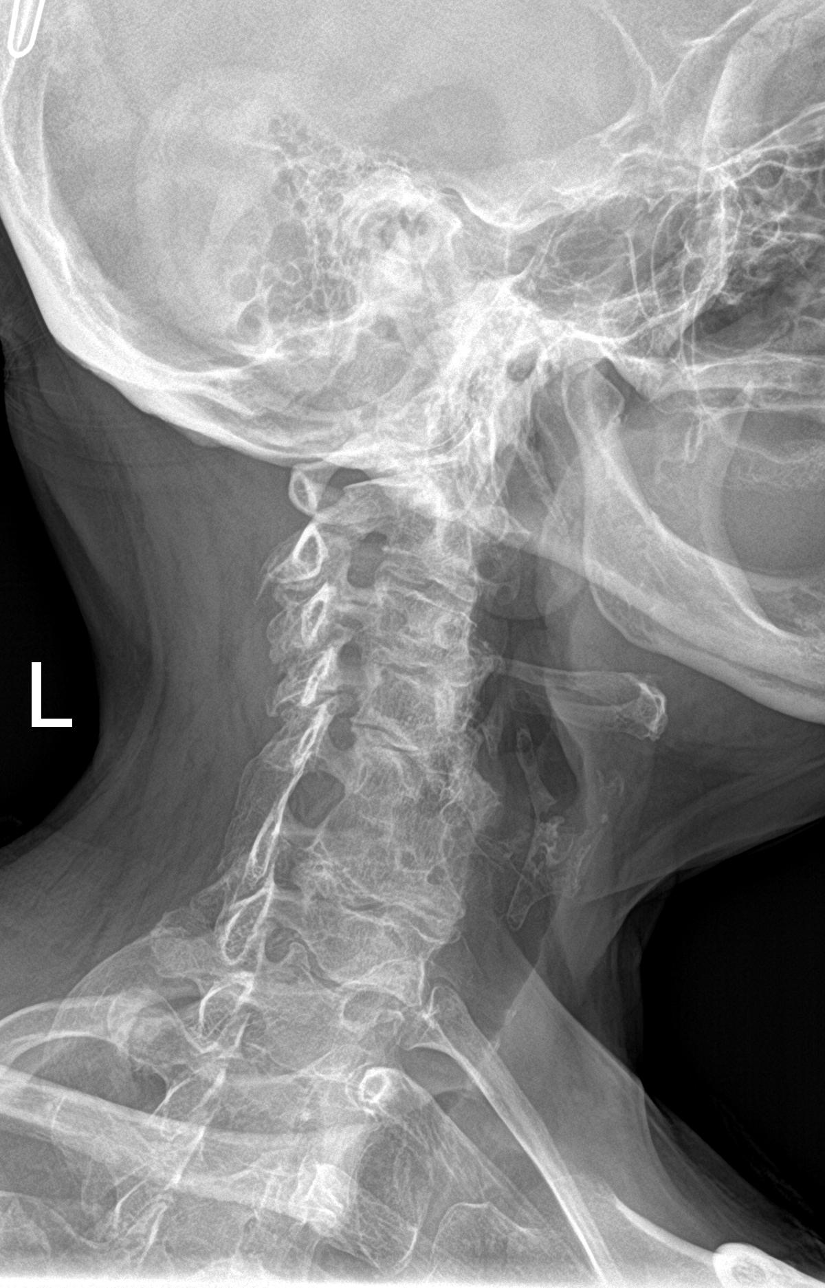

[c-spine open mouth]
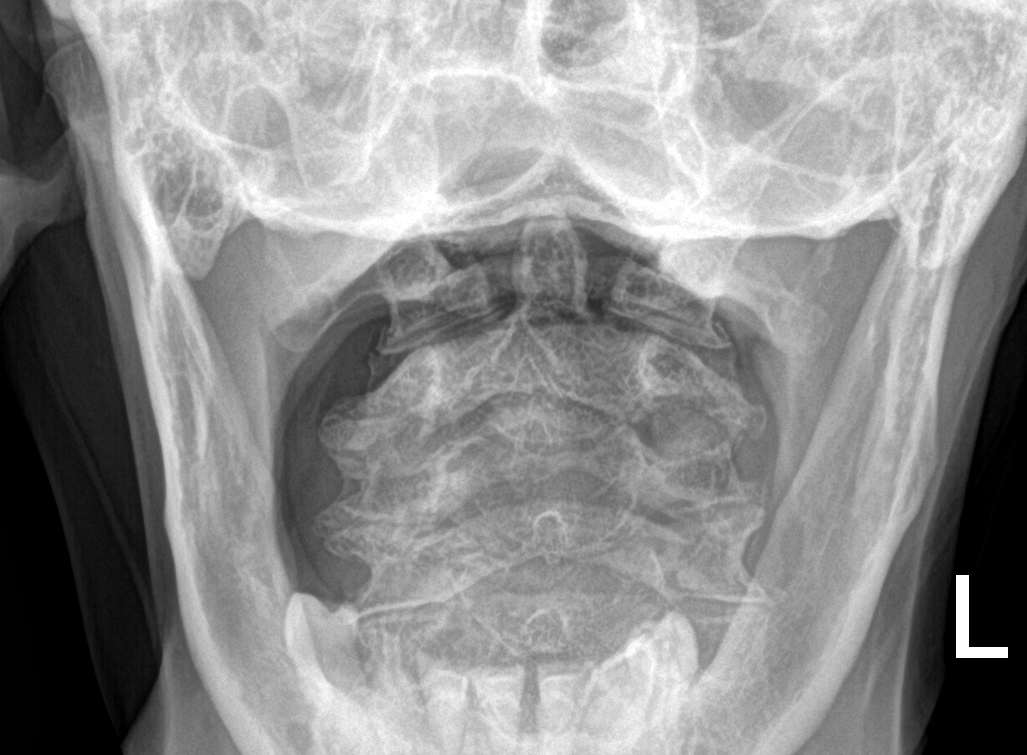

[c-spine ap]
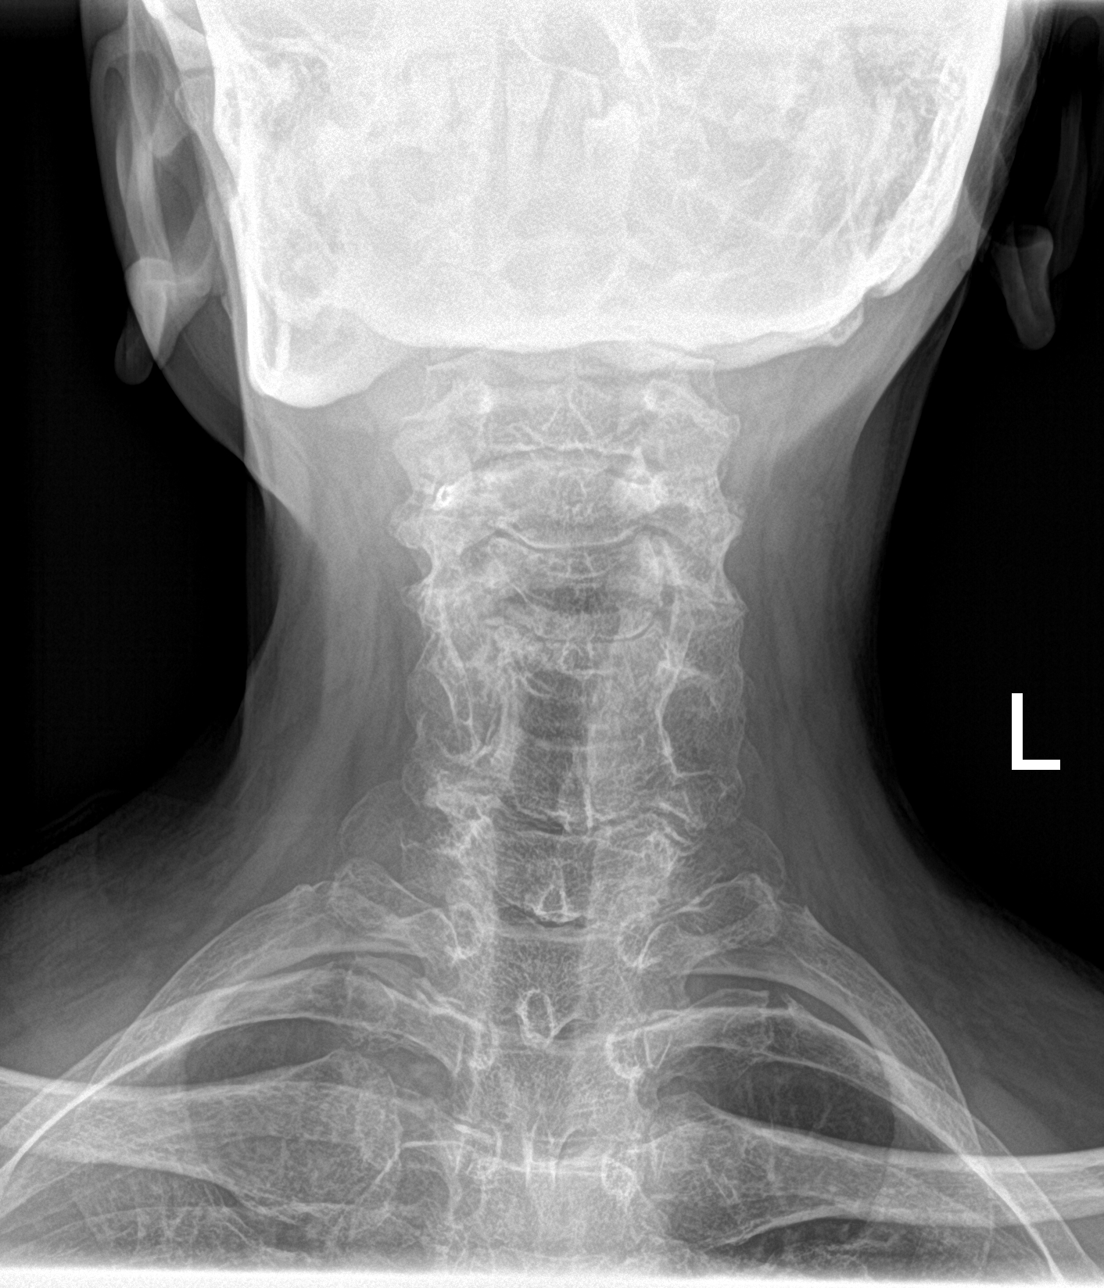

[5 of 5 positions shown; findings below may reference images not displayed]

FINDINGS: Frontal, bilateral oblique, lateral views of the cervical spine are
obtained. Bony fusion across the C5/C6 disc space, with
straightening of the cervical spine as result. Otherwise alignment
is anatomic. There are no acute displaced fractures. There is
diffuse spondylosis at the remaining levels, most pronounced at
C3-4, C4-5, and C6-7. There is mild right neural foraminal
encroachment at C4-5, with moderate bilateral symmetrical neural
foraminal encroachment at C6-7. Remaining neural foramina are
patent. Prevertebral soft tissues are normal. Lung apices are clear.
IMPRESSION: 1. No acute cervical spine fracture.
2. C5/C6 fusion.
3. Extensive multilevel spondylosis, greatest at C4-5 and C6-7.

## 2022-06-21 ENCOUNTER — Encounter (HOSPITAL_COMMUNITY): Payer: Self-pay | Admitting: *Deleted

## 2022-06-21 ENCOUNTER — Other Ambulatory Visit: Payer: Self-pay

## 2022-06-21 ENCOUNTER — Emergency Department (HOSPITAL_COMMUNITY): Payer: Medicare HMO

## 2022-06-21 ENCOUNTER — Emergency Department (HOSPITAL_COMMUNITY)
Admission: EM | Admit: 2022-06-21 | Discharge: 2022-06-21 | Disposition: A | Discharge status: Home / Self Care | Payer: Medicare HMO | Attending: Emergency Medicine | Admitting: Emergency Medicine

## 2022-06-21 DIAGNOSIS — E876 Hypokalemia: Secondary | ICD-10-CM | POA: Diagnosis not present

## 2022-06-21 DIAGNOSIS — E875 Hyperkalemia: Secondary | ICD-10-CM | POA: Diagnosis not present

## 2022-06-21 DIAGNOSIS — D72829 Elevated white blood cell count, unspecified: Secondary | ICD-10-CM | POA: Insufficient documentation

## 2022-06-21 DIAGNOSIS — R059 Cough, unspecified: Secondary | ICD-10-CM | POA: Diagnosis present

## 2022-06-21 DIAGNOSIS — E871 Hypo-osmolality and hyponatremia: Secondary | ICD-10-CM | POA: Diagnosis not present

## 2022-06-21 DIAGNOSIS — J069 Acute upper respiratory infection, unspecified: Secondary | ICD-10-CM | POA: Diagnosis not present

## 2022-06-21 DIAGNOSIS — Z1152 Encounter for screening for COVID-19: Secondary | ICD-10-CM | POA: Insufficient documentation

## 2022-06-21 LAB — COMPREHENSIVE METABOLIC PANEL
ALT: 14 U/L (ref 0–44)
AST: 22 U/L (ref 15–41)
Albumin: 3.1 g/dL — ABNORMAL LOW (ref 3.5–5.0)
Alkaline Phosphatase: 37 U/L — ABNORMAL LOW (ref 38–126)
Anion gap: 10 (ref 5–15)
BUN: 18 mg/dL (ref 8–23)
CO2: 25 mmol/L (ref 22–32)
Calcium: 8.4 mg/dL — ABNORMAL LOW (ref 8.9–10.3)
Chloride: 98 mmol/L (ref 98–111)
Creatinine, Ser: 1.16 mg/dL — ABNORMAL HIGH (ref 0.44–1.00)
GFR, Estimated: 51 mL/min — ABNORMAL LOW (ref >60)
Glucose, Bld: 150 mg/dL — ABNORMAL HIGH (ref 70–99)
Potassium: 3.2 mmol/L — ABNORMAL LOW (ref 3.5–5.1)
Sodium: 133 mmol/L — ABNORMAL LOW (ref 135–145)
Total Bilirubin: 1 mg/dL (ref 0.3–1.2)
Total Protein: 7.6 g/dL (ref 6.5–8.1)

## 2022-06-21 LAB — CBC WITH DIFFERENTIAL/PLATELET
Abs Immature Granulocytes: 0.07 10*3/uL (ref 0.00–0.07)
Basophils Absolute: 0 10*3/uL (ref 0.0–0.1)
Basophils Relative: 0 %
Eosinophils Absolute: 0 10*3/uL (ref 0.0–0.5)
Eosinophils Relative: 0 %
HCT: 33 % — ABNORMAL LOW (ref 36.0–46.0)
Hemoglobin: 10.4 g/dL — ABNORMAL LOW (ref 12.0–15.0)
Immature Granulocytes: 1 %
Lymphocytes Relative: 7 %
Lymphs Abs: 0.8 10*3/uL (ref 0.7–4.0)
MCH: 25.9 pg — ABNORMAL LOW (ref 26.0–34.0)
MCHC: 31.5 g/dL (ref 30.0–36.0)
MCV: 82.1 fL (ref 80.0–100.0)
Monocytes Absolute: 1 10*3/uL (ref 0.1–1.0)
Monocytes Relative: 8 %
Neutro Abs: 10.3 10*3/uL — ABNORMAL HIGH (ref 1.7–7.7)
Neutrophils Relative %: 84 %
Platelets: 210 10*3/uL (ref 150–400)
RBC: 4.02 MIL/uL (ref 3.87–5.11)
RDW: 15.4 % (ref 11.5–15.5)
WBC: 12.3 10*3/uL — ABNORMAL HIGH (ref 4.0–10.5)
nRBC: 0 % (ref 0.0–0.2)

## 2022-06-21 LAB — RESP PANEL BY RT-PCR (RSV, FLU A&B, COVID)  RVPGX2
Influenza A by PCR: NEGATIVE
Influenza B by PCR: NEGATIVE
Resp Syncytial Virus by PCR: NEGATIVE
SARS Coronavirus 2 by RT PCR: NEGATIVE

## 2022-06-21 MED ORDER — ACETAMINOPHEN 325 MG PO TABS
650.0000 mg | ORAL_TABLET | Freq: Once | ORAL | Status: AC
  Administered 2022-06-21: 650 mg via ORAL
  Filled 2022-06-21: qty 2

## 2022-06-21 MED ORDER — POTASSIUM CHLORIDE CRYS ER 20 MEQ PO TBCR
40.0000 meq | EXTENDED_RELEASE_TABLET | Freq: Once | ORAL | Status: AC
  Administered 2022-06-21: 40 meq via ORAL
  Filled 2022-06-21: qty 2

## 2022-06-21 MED ORDER — IBUPROFEN 400 MG PO TABS
400.0000 mg | ORAL_TABLET | Freq: Once | ORAL | Status: AC
  Administered 2022-06-21: 400 mg via ORAL
  Filled 2022-06-21: qty 1

## 2022-06-21 NOTE — ED Notes (Signed)
Pt report received from previous nurse. Pt A&O x4, vitals stable, states she has generalized weakness and pain all over body. Call bell in reach. No other acute distress noted.

## 2022-06-21 NOTE — Discharge Instructions (Signed)
You are seen in the emergency department for generalized bodyaches.  Unfortunately was no clear diagnosis that we could make, and what was causing your symptoms but this is likely a due to a viral upper respiratory infection.  Please manage her symptoms at home with over-the-counter medications for the next few days including Tylenol, ibuprofen, Aleve for fever and bodyaches.  If you begin to experience any severe chest pain or shortness of breath please return to the emergency department for evaluation.

## 2022-06-21 NOTE — ED Triage Notes (Signed)
The pt has been ill for 4 days with cough cold chest congestion she hurts everywhere chills maybe a temp productive cough  greenish and she had blood in her sputum today earlier

## 2022-06-21 NOTE — ED Notes (Signed)
Pt medicated for pain, denies any other needs at this time, call bell in reach.

## 2022-06-21 NOTE — ED Provider Notes (Signed)
[facility] Provider Note   CSN: [id number] Arrival date & time: 06/21/22  1823     History Chief Complaint  Patient presents with   Generalized Body Aches    Shye Lavigne Pura Nesbitt is a 72 y.o. female.  Patient presents emergency department complaints of generalized bodyaches, cough, chest congestion, myalgia.  She also reports she has had subjective fevers at home and some discolored sputum.  Patient has tried over-the-counter medications at home to treat his manage symptoms at home without any significant improvement.  HPI     Home Medications Prior to Admission medications   Medication Sig Start Date End Date Taking? Authorizing Provider  cephALEXin (KEFLEX) 500 MG capsule Take 1 capsule (500 mg total) by mouth 4 (four) times daily. 03/06/20   Deno Etienne, DO  cyclobenzaprine (FLEXERIL) 5 MG tablet Take 1 tablet (5 mg total) by mouth 3 (three) times daily as needed for muscle spasms. 07/11/20   Horton, Kristie M, DO  DUREZOL 0.05 % EMUL Place 1 drop into the right eye 3 (three) times daily. 03/29/19   [provider]  EPINEPHrine (EPIPEN 2-PAK) 0.3 mg/0.3 mL IJ SOAJ injection Inject 0.3 mLs (0.3 mg total) into the muscle once for 1 dose. 06/02/19 06/02/19  Kennith Gain, MD  EPINEPHrine 0.3 mg/0.3 mL IJ SOAJ injection Inject 0.3 mLs (0.3 mg total) into the muscle as needed for anaphylaxis. 03/03/19   Kerin Perna, NP  famotidine (PEPCID) 20 MG tablet Take 1 tablet (20 mg total) by mouth daily. 06/02/19   Kennith Gain, MD  ibuprofen (ADVIL) 600 MG tablet Take 1 tablet (600 mg total) by mouth every 6 (six) hours as needed. 07/11/20   Horton, Alvin Critchley, DO      Allergies    Patient has no known allergies.    Review of Systems   Review of Systems  Constitutional:  Positive for fever.  Musculoskeletal:  Positive for myalgias.  All other systems reviewed and are negative.   Physical  Exam Updated Vital Signs BP 126/82   Pulse 65   Temp 98.5 F (36.9 C) (Oral)   Resp 20   Ht '5\' 7"'$  (1.702 m)   Wt 78 kg   SpO2 99%   BMI 26.94 kg/m  Physical Exam Vitals and nursing note reviewed.  Constitutional:      Appearance: Normal appearance.  HENT:     Head: Normocephalic and atraumatic.     Right Ear: Tympanic membrane normal.     Left Ear: Tympanic membrane normal.     Nose: Congestion present.     Mouth/Throat:     Mouth: Mucous membranes are moist.     Pharynx: No oropharyngeal exudate or posterior oropharyngeal erythema.  Eyes:     Conjunctiva/sclera: Conjunctivae normal.     Pupils: Pupils are equal, round, and reactive to light.  Cardiovascular:     Rate and Rhythm: Normal rate and regular rhythm.     Pulses: Normal pulses.     Heart sounds: Normal heart sounds.  Pulmonary:     Effort: Pulmonary effort is normal. No respiratory distress.     Breath sounds: Normal breath sounds. No wheezing.  Skin:    General: Skin is warm and dry.     Capillary Refill: Capillary refill takes less than 2 seconds.  Neurological:     General: No focal deficit present.     Mental Status: She is alert.  Psychiatric:  Mood and Affect: Mood normal.     ED Results / Procedures / Treatments   Labs (all labs ordered are listed, but only abnormal results are displayed) Labs Reviewed  CBC WITH DIFFERENTIAL/PLATELET - Abnormal; Notable for the following components:      Result Value   WBC 12.3 (*)    Hemoglobin 10.4 (*)    HCT 33.0 (*)    MCH 25.9 (*)    Neutro Abs 10.3 (*)    All other components within normal limits  COMPREHENSIVE METABOLIC PANEL - Abnormal; Notable for the following components:   Sodium 133 (*)    Potassium 3.2 (*)    Glucose, Bld 150 (*)    Creatinine, Ser 1.16 (*)    Calcium 8.4 (*)    Albumin 3.1 (*)    Alkaline Phosphatase 37 (*)    GFR, Estimated 51 (*)    All other components within normal limits  RESP PANEL BY RT-PCR (RSV, FLU A&B,  COVID)  [id number]    EKG EKG Interpretation  Date/Time:  Saturday June 21 2022 18:42:46 EST Ventricular Rate:  82 PR Interval:  130 QRS Duration: 72 QT Interval:  358 QTC Calculation: 418 R Axis:   -3 Text Interpretation: Sinus rhythm with occasional Premature ventricular complexes Minimal voltage criteria for LVH, may be normal variant ( R in aVL ) Borderline ECG No previous ECGs available Confirmed by Lennice Sites ([id number]) on 06/21/2022 7:08:36 PM  Radiology DG Chest Portable 1 View  Result Date: 06/21/2022 CLINICAL DATA:  Cough EXAM: PORTABLE CHEST 1 VIEW COMPARISON:  06/24/2017 FINDINGS: Cardiac shadow is at the upper limits of normal in size. Aortic calcifications are noted. The lungs are well aerated bilaterally. No focal infiltrate or effusion is seen. Mild central vascular congestion is noted. IMPRESSION: Mild central vascular congestion.  No edema is noted. Electronically Signed   By: Inez Catalina M.D.   On: 06/21/2022 19:39    Procedures Procedures   Medications Ordered in ED Medications  acetaminophen (TYLENOL) tablet 650 mg (650 mg Oral Given 06/21/22 1955)  potassium chloride SA (KLOR-CON M) CR tablet 40 mEq (40 mEq Oral Given 06/21/22 2101)  ibuprofen (ADVIL) tablet 400 mg (400 mg Oral Given 06/21/22 2121)    ED Course/ Medical Decision Making/ A&P Clinical Course as of 06/21/22 2150  Sat Jun 21, 2022  2119 Potassium(!): 3.2 Mild hyperkalemia likely due to poor intake [OZ]  2119 WBC(!): 12.3 Leukocytosis unclear if due to infectious etiology or stress [OZ]  2120 DG Chest Portable 1 View No reactive airway or pneumonia noted [OZ]  2120 ED EKG NSR with occasional PVC [OZ]    Clinical Course User Index [OZ] Luvenia Heller, PA-C                             Medical Decision Making Amount and/or Complexity of Data Reviewed Radiology: ordered.  Risk OTC drugs. Prescription drug management.   This patient presents to the ED for concern of generalized bodyaches.   Differential diagnosis includes viral URI, influenza, COVID-19, RSV, HIV    Lab Tests:  I Ordered, and personally interpreted labs.  The pertinent results include: Mild dehydration noted with a hyponatremia at 133 and hypokalemia 3.2, leukocytosis of 12.3, respiratory viral panel negative   Imaging Studies ordered:  I ordered imaging studies including chest x-ray I independently visualized and interpreted imaging which showed some vascular congestion noted I agree with the radiologist interpretation  Medicines ordered and prescription drug management:  I ordered medication including Tylenol, ibuprofen, potassium for fever, hypokalemia Reevaluation of the patient after these medicines showed that the patient improved I have reviewed the patients home medicines and have made adjustments as needed   Problem List / ED Course:  Patient presented emergency department complaints of generalized bodyaches present for the last 4 days.  She reports he has been having some chest congestion and chills throughout her whole body with pain everywhere.  Patient reports she tried "everything at home" and feels that she cannot tolerate the chills that she gets throughout her body.  Given that patient's labs are otherwise reassuring with only minimal changes due to hydration status as patient has not been eating or drinking well for the last few days, I am not highly concerned for this patient. I am concerned that this is likely some other viral URI process happening at this time.  Informed patient of results and verbalized that I believe that she is safe for discharge.  Patient has mixed feelings about discharge, she feels that she is unable to manage her symptoms well at home overall she reports she has taken is cold and flu medicine for few days.  Informed patient she knows return back to the emergency room for further evaluation if she is concerned about how she is feeling.  Patient agreeable treatment  plan verbalized understand return precautions.  All questions answered prior to patient discharge.  Final Clinical Impression(s) / ED Diagnoses Final diagnoses:  Viral URI with cough    Rx / DC Orders ED Discharge Orders     None         Luvenia Heller, PA-C 06/21/22 2150    Lennice Sites, DO 06/21/22 2304

## 2023-11-26 ENCOUNTER — Other Ambulatory Visit (HOSPITAL_COMMUNITY)
Admission: RE | Admit: 2023-11-26 | Discharge: 2023-11-26 | Disposition: A | Discharge status: Home / Self Care | Source: Ambulatory Visit | Attending: Obstetrics | Admitting: Obstetrics

## 2023-11-26 ENCOUNTER — Ambulatory Visit: Admitting: Obstetrics

## 2023-11-26 ENCOUNTER — Encounter: Payer: Self-pay | Admitting: Obstetrics

## 2023-11-26 VITALS — BP 130/77 | HR 64 | Ht 67.0 in | Wt 172.0 lb

## 2023-11-26 DIAGNOSIS — Z1239 Encounter for other screening for malignant neoplasm of breast: Secondary | ICD-10-CM

## 2023-11-26 DIAGNOSIS — Z01419 Encounter for gynecological examination (general) (routine) without abnormal findings: Secondary | ICD-10-CM | POA: Diagnosis not present

## 2023-11-26 DIAGNOSIS — E2839 Other primary ovarian failure: Secondary | ICD-10-CM | POA: Diagnosis not present

## 2023-11-26 DIAGNOSIS — N3001 Acute cystitis with hematuria: Secondary | ICD-10-CM | POA: Diagnosis not present

## 2023-11-26 DIAGNOSIS — D251 Intramural leiomyoma of uterus: Secondary | ICD-10-CM | POA: Diagnosis not present

## 2023-11-26 DIAGNOSIS — Z113 Encounter for screening for infections with a predominantly sexual mode of transmission: Secondary | ICD-10-CM

## 2023-11-26 DIAGNOSIS — N898 Other specified noninflammatory disorders of vagina: Secondary | ICD-10-CM | POA: Diagnosis present

## 2023-11-26 DIAGNOSIS — Z1151 Encounter for screening for human papillomavirus (HPV): Secondary | ICD-10-CM | POA: Insufficient documentation

## 2023-11-26 DIAGNOSIS — Z780 Asymptomatic menopausal state: Secondary | ICD-10-CM | POA: Diagnosis not present

## 2023-11-26 DIAGNOSIS — N393 Stress incontinence (female) (male): Secondary | ICD-10-CM

## 2023-11-26 LAB — POCT URINALYSIS DIPSTICK
Bilirubin, UA: NEGATIVE
Glucose, UA: NEGATIVE
Nitrite, UA: POSITIVE
Protein, UA: POSITIVE — AB
Spec Grav, UA: 1.01 (ref 1.010–1.025)
Urobilinogen, UA: 1 U/dL
pH, UA: 5 (ref 5.0–8.0)

## 2023-11-26 MED ORDER — CEFUROXIME AXETIL 500 MG PO TABS: 500.0000 mg | ORAL_TABLET | Freq: Two times a day (BID) | ORAL | 0 refills | Status: DC

## 2023-11-26 NOTE — Progress Notes (Signed)
 Subjective:        Betty Mercer is a 73 y.o. female here for a routine exam.  Current complaints: Malodorous yellowish-green vaginal discharge.  She says that she suspects that her partner may have transmitted an infection to her.  Also c/o urinary frequency, and leaking of urine with cough, sneeze, etc.  Denies dysuria, fever or pelvic pain.  Past Gyn Hx significant for uterine fibroids, and heavy periods when premenopausal..    Personal health questionnaire:  Is patient Ashkenazi Jewish, have a family history of breast and/or ovarian cancer: yes Is there a family history of uterine cancer diagnosed at age < 83, gastrointestinal cancer, urinary tract cancer, family member who is a Personnel officer syndrome-associated carrier: no Is the patient overweight and hypertensive, family history of diabetes, personal history of gestational diabetes, preeclampsia or PCOS: no Is patient over 2, have PCOS,  family history of premature CHD under age 16, diabetes, smoke, have hypertension or peripheral artery disease:  no At any time, has a partner hit, kicked or otherwise hurt or frightened you?: no Over the past 2 weeks, have you felt down, depressed or hopeless?: no Over the past 2 weeks, have you felt little interest or pleasure in doing things?:no   Gynecologic History No LMP recorded. Patient is postmenopausal. Contraception: post menopausal status Last Pap: 2020. Results were: normal Last mammogram: 2020. Results were: normal  Obstetric History OB History  Gravida Para Term Preterm AB Living  0 0 0 0 0 0  SAB IAB Ectopic Multiple Live Births  0 0 0 0 0    Past Medical History:  Diagnosis Date   Angio-edema     History reviewed. No pertinent surgical history.   Current Outpatient Medications:    cefUROXime  (CEFTIN ) 500 MG tablet, Take 1 tablet (500 mg total) by mouth 2 (two) times daily with a meal., Disp: 14 tablet, Rfl: 0   DUREZOL 0.05 % EMUL, Place 1 drop into the  right eye 3 (three) times daily., Disp: , Rfl:    EPINEPHrine  0.3 mg/0.3 mL IJ SOAJ injection, Inject 0.3 mLs (0.3 mg total) into the muscle as needed for anaphylaxis., Disp: 1 each, Rfl: 1   famotidine  (PEPCID ) 20 MG tablet, Take 1 tablet (20 mg total) by mouth daily., Disp: 30 tablet, Rfl: 5   ibuprofen  (ADVIL ) 600 MG tablet, Take 1 tablet (600 mg total) by mouth every 6 (six) hours as needed., Disp: 30 tablet, Rfl: 0   cephALEXin  (KEFLEX ) 500 MG capsule, Take 1 capsule (500 mg total) by mouth 4 (four) times daily. (Patient not taking: Reported on 11/26/2023), Disp: 40 capsule, Rfl: 0   cyclobenzaprine  (FLEXERIL ) 5 MG tablet, Take 1 tablet (5 mg total) by mouth 3 (three) times daily as needed for muscle spasms. (Patient not taking: Reported on 11/26/2023), Disp: 10 tablet, Rfl: 0   EPINEPHrine  (EPIPEN  2-PAK) 0.3 mg/0.3 mL IJ SOAJ injection, Inject 0.3 mLs (0.3 mg total) into the muscle once for 1 dose. (Patient not taking: Reported on 11/26/2023), Disp: 2 each, Rfl: 1 No Known Allergies  Social History   Tobacco Use   Smoking status: Never   Smokeless tobacco: Never  Substance Use Topics   Alcohol use: Yes    Comment: social    Family History  Problem Relation Age of Onset   Cancer Mother    Heart failure Father    Breast cancer Sister 52   Allergic rhinitis Neg Hx    Angioedema Neg Hx  Asthma Neg Hx    Atopy Neg Hx    Eczema Neg Hx    Immunodeficiency Neg Hx    Urticaria Neg Hx       Review of Systems  Constitutional: negative for fatigue and weight loss Respiratory: negative for cough and wheezing Cardiovascular: negative for chest pain, fatigue and palpitations Gastrointestinal: negative for abdominal pain and change in bowel habits Musculoskeletal:negative for myalgias Neurological: negative for gait problems and tremors Behavioral/Psych: negative for abusive relationship, depression Endocrine: negative for temperature intolerance    Genitourinary: positive for malodorous  yellowish-green vaginal discharge, and leaking of urine with cough, sneeze, etc.  negative for abnormal menstrual periods, genital lesions, hot flashes, sexual problems  Integument/breast: negative for breast lump, breast tenderness, nipple discharge and skin lesion(s)    Objective:       BP 130/77   Pulse 64   Ht 5' 7 (1.702 m)   Wt 172 lb (78 kg)   BMI 26.94 kg/m  General:   Alert and no distress  Skin:   no rash or abnormalities  Lungs:   clear to auscultation bilaterally  Heart:   regular rate and rhythm, S1, S2 normal, no murmur, click, rub or gallop  Breasts:   normal without suspicious masses, skin or nipple changes or axillary nodes  Abdomen:  normal findings: no organomegaly, soft, non-tender and no hernia  Pelvis:  External genitalia: normal general appearance Urinary system: urethral meatus normal and bladder without fullness, nontender Vaginal: normal without tenderness, induration or masses Cervix: normal appearance Adnexa: normal bimanual exam Uterus: anteverted and non-tender, normal size   Lab Review Urine pregnancy test Labs reviewed yes Radiologic studies reviewed yes  I have spent a total of 25 minutes of face-to-face time, excluding clinical staff time, reviewing notes and preparing to see patient, ordering tests and/or medications, and counseling the patient.   Assessment:    1. Encounter for gynecological examination with Papanicolaou smear of cervix (Primary) Rx: - Cytology - PAP( [place])  2. Postmenopause - doing well  3. Hypoestrogenism Rx: - DG BONE DENSITY (DXA); Future  4. Fibroids, intramural, asymptomatic Rx: - US  PELVIC COMPLETE WITH TRANSVAGINAL; Future  5. Vaginal discharge Rx: - Cervicovaginal ancillary only  6. Screening for STD (sexually transmitted disease) Rx: - HIV Antibody (routine testing w rflx) - Hepatitis B surface antigen - RPR - Hepatitis C antibody  7. Acute cystitis with hematuria Rx: - POCT  Urinalysis Dipstick - Urine Culture - cefUROXime  (CEFTIN ) 500 MG tablet; Take 1 tablet (500 mg total) by mouth 2 (two) times daily with a meal.  Dispense: 14 tablet; Refill: 0  8. SUI (stress urinary incontinence, female) Rx: - Ambulatory referral to Urogynecology  9. Screening breast examination Rx: - MM Digital Screening; Future     Plan:    Education reviewed: calcium supplements, depression evaluation, low fat, low cholesterol diet, safe sex/STD prevention, self breast exams, and weight bearing exercise. Mammogram ordered. Follow up in: 2 weeks. Bone Density Study ordered   Meds ordered this encounter  Medications   cefUROXime  (CEFTIN ) 500 MG tablet    Sig: Take 1 tablet (500 mg total) by mouth 2 (two) times daily with a meal.    Dispense:  14 tablet    Refill:  0   Orders Placed This Encounter  Procedures   Urine Culture   MM Digital Screening    Standing Status:   Future    Expiration Date:   11/25/2024    Reason for Exam (  SYMPTOM  OR DIAGNOSIS REQUIRED):   Screening    Preferred imaging location?:   [facility]   DG BONE DENSITY (DXA)    Standing Status:   Future    Expiration Date:   11/25/2024    Reason for Exam (SYMPTOM  OR DIAGNOSIS REQUIRED):   Hypoestrogenism.  Screening for osteoporosis.    Preferred imaging location?:   [facility]   US  PELVIC COMPLETE WITH TRANSVAGINAL    Standing Status:   Future    Expiration Date:   11/25/2024    Reason for Exam (SYMPTOM  OR DIAGNOSIS REQUIRED):   Fibroids    Preferred imaging location?:   [facility]   HIV Antibody (routine testing w rflx)   Hepatitis B surface antigen   RPR   Hepatitis C antibody   Ambulatory referral to Urogynecology    Referral Priority:   Routine    Referral Type:   Consultation    Referral Reason:   Specialty Services Required    Requested Specialty:   Urology    Number of Visits Requested:   1   POCT Urinalysis Dipstick     CARLIN RONAL CENTERS, MD, FACOG Attending  Obstetrician & Gynecologist, [facility] for [facility], [facility], [place] 11/26/2023

## 2023-11-26 NOTE — Progress Notes (Signed)
 Last pap 2020. Last mammo 4-5 years ago. Colonoscopy 3 ys ago WNL No hx DexaScan done. Thinks may have bone softening. States discharge yellow-green thick with odor. Denies itching or burning. Wants check for UTI. Discharge since beginning of July.

## 2023-11-27 LAB — HEPATITIS C ANTIBODY: Hep C Virus Ab: NONREACTIVE

## 2023-11-27 LAB — SYPHILIS: RPR W/REFLEX TO RPR TITER AND TREPONEMAL ANTIBODIES, TRADITIONAL SCREENING AND DIAGNOSIS ALGORITHM: RPR Ser Ql: NONREACTIVE

## 2023-11-27 LAB — HIV ANTIBODY (ROUTINE TESTING W REFLEX): HIV Screen 4th Generation wRfx: NONREACTIVE

## 2023-11-27 LAB — HEPATITIS B SURFACE ANTIGEN: Hepatitis B Surface Ag: NEGATIVE

## 2023-11-30 ENCOUNTER — Telehealth: Payer: Self-pay

## 2023-11-30 LAB — CERVICOVAGINAL ANCILLARY ONLY
Bacterial Vaginitis (gardnerella): NEGATIVE
Candida Glabrata: NEGATIVE
Candida Vaginitis: NEGATIVE
Chlamydia: NEGATIVE
Comment: NEGATIVE
Comment: NEGATIVE
Comment: NEGATIVE
Comment: NEGATIVE
Comment: NEGATIVE
Comment: NORMAL
Neisseria Gonorrhea: NEGATIVE
Trichomonas: POSITIVE — AB

## 2023-11-30 LAB — URINE CULTURE

## 2023-11-30 NOTE — Telephone Encounter (Signed)
 TC from pt, left VM on triage line, requesting to speak with Dr. Rudy. Had a reaction to abx that was prescribed, would like a new rx sent. Dr. Rudy in office and informed that pt would like to discuss with him. Dr. Rudy is going to call pt

## 2023-12-01 LAB — CYTOLOGY - PAP
Comment: NEGATIVE
Diagnosis: NEGATIVE
High risk HPV: NEGATIVE

## 2023-12-04 ENCOUNTER — Ambulatory Visit (HOSPITAL_COMMUNITY)

## 2023-12-10 ENCOUNTER — Encounter: Payer: Self-pay | Admitting: Obstetrics

## 2023-12-10 ENCOUNTER — Ambulatory Visit: Admitting: Obstetrics

## 2023-12-10 DIAGNOSIS — N3001 Acute cystitis with hematuria: Secondary | ICD-10-CM | POA: Diagnosis not present

## 2023-12-10 DIAGNOSIS — A599 Trichomoniasis, unspecified: Secondary | ICD-10-CM

## 2023-12-10 MED ORDER — CIPROFLOXACIN HCL 500 MG PO TABS: 500.0000 mg | ORAL_TABLET | Freq: Two times a day (BID) | ORAL | 0 refills | Status: AC

## 2023-12-10 MED ORDER — METRONIDAZOLE 500 MG PO TABS: 500.0000 mg | ORAL_TABLET | Freq: Two times a day (BID) | ORAL | 2 refills | Status: AC

## 2023-12-10 NOTE — Progress Notes (Signed)
 Patient ID: Betty Mercer, female   DOB: Aug 17, 1950, 73 y.o.   MRN: [id number]  No chief complaint on file.   HPI Betty Mercer is a 73 y.o. female.  Presents for follow up consultation for vaginal discharge after suspected exposure to STI.  Also c/o an allergic reaction to medication given for UTI. HPI  Past Medical History:  Diagnosis Date   Angio-edema     History reviewed. No pertinent surgical history.  Family History  Problem Relation Age of Onset   Cancer Mother    Heart failure Father    Breast cancer Sister 26   Allergic rhinitis Neg Hx    Angioedema Neg Hx    Asthma Neg Hx    Atopy Neg Hx    Eczema Neg Hx    Immunodeficiency Neg Hx    Urticaria Neg Hx     Social History Social History   Tobacco Use   Smoking status: Never   Smokeless tobacco: Never  Vaping Use   Vaping status: Never Used  Substance Use Topics   Alcohol use: Yes    Comment: social   Drug use: No    Allergies  Allergen Reactions   Ceftin  [Cefuroxime ] Itching and Swelling    Current Outpatient Medications  Medication Sig Dispense Refill   ciprofloxacin  (CIPRO ) 500 MG tablet Take 1 tablet (500 mg total) by mouth 2 (two) times daily. 6 tablet 0   metroNIDAZOLE  (FLAGYL ) 500 MG tablet Take 1 tablet (500 mg total) by mouth 2 (two) times daily. 14 tablet 2   cyclobenzaprine  (FLEXERIL ) 5 MG tablet Take 1 tablet (5 mg total) by mouth 3 (three) times daily as needed for muscle spasms. (Patient not taking: Reported on 11/26/2023) 10 tablet 0   DUREZOL 0.05 % EMUL Place 1 drop into the right eye 3 (three) times daily.     EPINEPHrine  (EPIPEN  2-PAK) 0.3 mg/0.3 mL IJ SOAJ injection Inject 0.3 mLs (0.3 mg total) into the muscle once for 1 dose. (Patient not taking: Reported on 11/26/2023) 2 each 1   EPINEPHrine  0.3 mg/0.3 mL IJ SOAJ injection Inject 0.3 mLs (0.3 mg total) into the muscle as needed for anaphylaxis. 1 each 1   famotidine  (PEPCID ) 20 MG tablet Take 1 tablet  (20 mg total) by mouth daily. 30 tablet 5   ibuprofen  (ADVIL ) 600 MG tablet Take 1 tablet (600 mg total) by mouth every 6 (six) hours as needed. 30 tablet 0   No current facility-administered medications for this visit.    Review of Systems Review of Systems Constitutional: negative for fatigue and weight loss Respiratory: negative for cough and wheezing Cardiovascular: negative for chest pain, fatigue and palpitations Gastrointestinal: negative for abdominal pain and change in bowel habits Genitourinary: POSITIVE FOR GREENISH VAGINAL DISCHARGE AND URINARY FREQUENCY Integument/breast: negative for nipple discharge Musculoskeletal:negative for myalgias Neurological: negative for gait problems and tremors Behavioral/Psych: negative for abusive relationship, depression Endocrine: negative for temperature intolerance      There were no vitals taken for this visit.  Physical Exam Physical Exam General:   Alert and no distress  Skin:   no rash or abnormalities  Lungs:   clear to auscultation bilaterally  Heart:   regular rate and rhythm, S1, S2 normal, no murmur, click, rub or gallop  The remainder of the physical exam deferred due to the type of encounter.   I have spent a total of 15 minutes of face-to-face time, excluding clinical staff time, reviewing notes and preparing to  see patient, ordering tests and/or medications, and counseling the patient.    Data Reviewed Wet Prep and Cultures  11/26/23 09:44  Chlamydia Negative  Neisseria Gonorrhea Negative  Trichomonas Positive !  Candida Vaginitis Negative  Candida Glabrata Negative  Bacterial Vaginitis (gardnerella) Negative  !: Data is abnormal   Organism ID, Bacteria Urine Culture Collected: 11/26/23 1204  Result status: Final  Resulting lab: [id number]  Value: Escherichia coli Abnormal   Comment: Cefazolin with an MIC <=16 predicts susceptibility to the oral agents cefaclor, cefdinir, cefpodoxime, cefprozil, cefuroxime ,  cephalexin , and loracarbef when used for therapy of uncomplicated urinary tract infections due to E. coli, Klebsiella pneumoniae, and Proteus mirabilis. Greater than 100,000 colony forming units per mL  *Additional information available - comment, narrative    Contains abnormal data Urine Culture Order: [id number]  Status: Final result     Next appt: 01/08/2024 at 02:30 PM in Radiology ([facility])     Dx: Acute cystitis with hematuria   Test Result Released: Yes (not seen)   Specimen Information: Urine  Specimen Comment: UR  0 Result Notes    Component Ref Range & Units (hover) 2 wk ago  Urine Culture, Routine Final report Abnormal   Organism ID, Bacteria Escherichia coli Abnormal   Comment: Cefazolin with an MIC <=16 predicts susceptibility to the oral agents cefaclor, cefdinir, cefpodoxime, cefprozil, cefuroxime , cephalexin , and loracarbef when used for therapy of uncomplicated urinary tract infections due to E. coli, Klebsiella pneumoniae, and Proteus mirabilis. Greater than 100,000 colony forming units per mL  Antimicrobial Susceptibility Comment  Comment:       ** S = Susceptible; I = Intermediate; R = Resistant **                    P = Positive; N = Negative             MICS are expressed in micrograms per mL    Antibiotic                 RSLT#1    RSLT#2    RSLT#3    RSLT#4 Amoxicillin/Clavulanic Acid    S =8 Ampicillin                     I =16 Cefazolin                      S =2 Cefepime                       S<=0.12 Cefoxitin                      S<=4 Cefpodoxime                    S =1 Ceftriaxone                     S<=0.25 Ciprofloxacin                   S<=0.06 Ertapenem                      S<=0.12 Gentamicin                     S<=1 Levofloxacin                   S<=0.12 Meropenem  S<=0.25 Nitrofurantoin                  S<=16 Piperacillin/Tazobactam        S<=4 Tetracycline                   S<=1 Tobramycin                      S<=1 Trimethoprim /Sulfa              S<=20  Resulting Agency [facility]         Assessment     1. Trichomonas infection (Primary) Rx: - metroNIDAZOLE  (FLAGYL ) 500 MG tablet; Take 1 tablet (500 mg total) by mouth 2 (two) times daily.  Dispense: 14 tablet; Refill: 2  2. Acute cystitis with hematuria Rx: - ciprofloxacin  (CIPRO ) 500 MG tablet; Take 1 tablet (500 mg total) by mouth 2 (two) times daily.  Dispense: 6 tablet; Refill: 0     Plan   Follow up prn   Meds ordered this encounter  Medications   ciprofloxacin  (CIPRO ) 500 MG tablet    Sig: Take 1 tablet (500 mg total) by mouth 2 (two) times daily.    Dispense:  6 tablet    Refill:  0   metroNIDAZOLE  (FLAGYL ) 500 MG tablet    Sig: Take 1 tablet (500 mg total) by mouth 2 (two) times daily.    Dispense:  14 tablet    Refill:  2     CARLIN RONAL CENTERS, MD, FACOG Attending Obstetrician & Gynecologist, [facility] for [facility], [facility], [place] 12/10/2023

## 2023-12-28 ENCOUNTER — Ambulatory Visit

## 2023-12-29 ENCOUNTER — Ambulatory Visit: Admitting: Obstetrics

## 2024-01-08 ENCOUNTER — Ambulatory Visit (HOSPITAL_COMMUNITY): Admission: RE | Admit: 2024-01-08 | Source: Ambulatory Visit

## 2024-05-02 ENCOUNTER — Encounter: Payer: Self-pay | Admitting: *Deleted
